# Patient Record
Sex: Male | Born: 1935 | Race: White | Hispanic: No | Marital: Married | Smoking: Former smoker
Health system: Southern US, Community
[De-identification: ages and names within clinical notes are randomized; demographics above are authoritative.]

## PROBLEM LIST (undated history)

## (undated) DIAGNOSIS — T82858A Stenosis of vascular prosthetic devices, implants and grafts, initial encounter: Secondary | ICD-10-CM

## (undated) DIAGNOSIS — I10 Essential (primary) hypertension: Secondary | ICD-10-CM

## (undated) DIAGNOSIS — J449 Chronic obstructive pulmonary disease, unspecified: Secondary | ICD-10-CM

## (undated) DIAGNOSIS — I219 Acute myocardial infarction, unspecified: Secondary | ICD-10-CM

## (undated) DIAGNOSIS — E119 Type 2 diabetes mellitus without complications: Secondary | ICD-10-CM

## (undated) HISTORY — PX: JOINT REPLACEMENT: SHX530

---

## 2009-12-02 HISTORY — PX: CORONARY ARTERY BYPASS GRAFT: SHX141

## 2014-01-15 ENCOUNTER — Encounter (HOSPITAL_COMMUNITY): Payer: Self-pay | Admitting: Emergency Medicine

## 2014-01-15 ENCOUNTER — Other Ambulatory Visit: Payer: Self-pay

## 2014-01-15 ENCOUNTER — Inpatient Hospital Stay (HOSPITAL_COMMUNITY)
Admission: EM | Admit: 2014-01-15 | Discharge: 2014-01-18 | DRG: 064 | Disposition: A | Discharge status: Hospice | Payer: Medicare Other | Attending: Internal Medicine | Admitting: Internal Medicine

## 2014-01-15 ENCOUNTER — Emergency Department (HOSPITAL_COMMUNITY): Payer: Medicare Other

## 2014-01-15 DIAGNOSIS — I63512 Cerebral infarction due to unspecified occlusion or stenosis of left middle cerebral artery: Secondary | ICD-10-CM

## 2014-01-15 DIAGNOSIS — R5381 Other malaise: Secondary | ICD-10-CM | POA: Diagnosis present

## 2014-01-15 DIAGNOSIS — F0390 Unspecified dementia without behavioral disturbance: Secondary | ICD-10-CM | POA: Diagnosis present

## 2014-01-15 DIAGNOSIS — J4489 Other specified chronic obstructive pulmonary disease: Secondary | ICD-10-CM | POA: Diagnosis present

## 2014-01-15 DIAGNOSIS — I6350 Cerebral infarction due to unspecified occlusion or stenosis of unspecified cerebral artery: Principal | ICD-10-CM | POA: Diagnosis present

## 2014-01-15 DIAGNOSIS — Z8673 Personal history of transient ischemic attack (TIA), and cerebral infarction without residual deficits: Secondary | ICD-10-CM

## 2014-01-15 DIAGNOSIS — G8190 Hemiplegia, unspecified affecting unspecified side: Secondary | ICD-10-CM | POA: Diagnosis present

## 2014-01-15 DIAGNOSIS — Z515 Encounter for palliative care: Secondary | ICD-10-CM

## 2014-01-15 DIAGNOSIS — I639 Cerebral infarction, unspecified: Secondary | ICD-10-CM

## 2014-01-15 DIAGNOSIS — I4901 Ventricular fibrillation: Secondary | ICD-10-CM | POA: Diagnosis present

## 2014-01-15 DIAGNOSIS — Z951 Presence of aortocoronary bypass graft: Secondary | ICD-10-CM

## 2014-01-15 DIAGNOSIS — I2510 Atherosclerotic heart disease of native coronary artery without angina pectoris: Secondary | ICD-10-CM | POA: Diagnosis present

## 2014-01-15 DIAGNOSIS — R4701 Aphasia: Secondary | ICD-10-CM | POA: Diagnosis present

## 2014-01-15 DIAGNOSIS — G936 Cerebral edema: Secondary | ICD-10-CM | POA: Diagnosis present

## 2014-01-15 DIAGNOSIS — I10 Essential (primary) hypertension: Secondary | ICD-10-CM | POA: Diagnosis present

## 2014-01-15 DIAGNOSIS — J449 Chronic obstructive pulmonary disease, unspecified: Secondary | ICD-10-CM | POA: Diagnosis present

## 2014-01-15 DIAGNOSIS — E119 Type 2 diabetes mellitus without complications: Secondary | ICD-10-CM | POA: Diagnosis present

## 2014-01-15 DIAGNOSIS — Z66 Do not resuscitate: Secondary | ICD-10-CM | POA: Diagnosis present

## 2014-01-15 DIAGNOSIS — R5383 Other fatigue: Secondary | ICD-10-CM

## 2014-01-15 DIAGNOSIS — I4892 Unspecified atrial flutter: Secondary | ICD-10-CM | POA: Diagnosis present

## 2014-01-15 DIAGNOSIS — Z87891 Personal history of nicotine dependence: Secondary | ICD-10-CM

## 2014-01-15 DIAGNOSIS — R0681 Apnea, not elsewhere classified: Secondary | ICD-10-CM | POA: Diagnosis present

## 2014-01-15 DIAGNOSIS — I4430 Unspecified atrioventricular block: Secondary | ICD-10-CM | POA: Diagnosis present

## 2014-01-15 DIAGNOSIS — I4891 Unspecified atrial fibrillation: Secondary | ICD-10-CM | POA: Diagnosis present

## 2014-01-15 HISTORY — DX: Chronic obstructive pulmonary disease, unspecified: J44.9

## 2014-01-15 HISTORY — DX: Acute myocardial infarction, unspecified: I21.9

## 2014-01-15 HISTORY — DX: Essential (primary) hypertension: I10

## 2014-01-15 HISTORY — DX: Type 2 diabetes mellitus without complications: E11.9

## 2014-01-15 HISTORY — DX: Stenosis of other vascular prosthetic devices, implants and grafts, initial encounter: T82.858A

## 2014-01-15 LAB — CBC WITH DIFFERENTIAL/PLATELET
BASOS ABS: 0 10*3/uL (ref 0.0–0.1)
BASOS PCT: 1 % (ref 0–1)
Eosinophils Absolute: 0.2 10*3/uL (ref 0.0–0.7)
Eosinophils Relative: 3 % (ref 0–5)
HEMATOCRIT: 47.2 % (ref 39.0–52.0)
Hemoglobin: 16.4 g/dL (ref 13.0–17.0)
LYMPHS PCT: 17 % (ref 12–46)
Lymphs Abs: 1.3 10*3/uL (ref 0.7–4.0)
MCH: 31 pg (ref 26.0–34.0)
MCHC: 34.7 g/dL (ref 30.0–36.0)
MCV: 89.2 fL (ref 78.0–100.0)
MONO ABS: 0.5 10*3/uL (ref 0.1–1.0)
MONOS PCT: 7 % (ref 3–12)
Neutro Abs: 5.4 10*3/uL (ref 1.7–7.7)
Neutrophils Relative %: 73 % (ref 43–77)
Platelets: 233 10*3/uL (ref 150–400)
RBC: 5.29 MIL/uL (ref 4.22–5.81)
RDW: 13.3 % (ref 11.5–15.5)
WBC: 7.4 10*3/uL (ref 4.0–10.5)

## 2014-01-15 LAB — URINE MICROSCOPIC-ADD ON

## 2014-01-15 LAB — COMPREHENSIVE METABOLIC PANEL
ALT: 14 U/L (ref 0–53)
AST: 18 U/L (ref 0–37)
Albumin: 3.7 g/dL (ref 3.5–5.2)
Alkaline Phosphatase: 121 U/L — ABNORMAL HIGH (ref 39–117)
BILIRUBIN TOTAL: 0.6 mg/dL (ref 0.3–1.2)
BUN: 21 mg/dL (ref 6–23)
CALCIUM: 9.5 mg/dL (ref 8.4–10.5)
CO2: 26 meq/L (ref 19–32)
CREATININE: 1.01 mg/dL (ref 0.50–1.35)
Chloride: 100 mEq/L (ref 96–112)
GFR, EST AFRICAN AMERICAN: 75 mL/min — AB (ref >90)
GFR, EST NON AFRICAN AMERICAN: 65 mL/min — AB (ref >90)
Glucose, Bld: 147 mg/dL — ABNORMAL HIGH (ref 70–99)
Potassium: 4.3 mEq/L (ref 3.7–5.3)
Sodium: 139 mEq/L (ref 137–147)
Total Protein: 7.9 g/dL (ref 6.0–8.3)

## 2014-01-15 LAB — URINALYSIS, ROUTINE W REFLEX MICROSCOPIC
Bilirubin Urine: NEGATIVE
Glucose, UA: NEGATIVE mg/dL
Ketones, ur: NEGATIVE mg/dL
LEUKOCYTES UA: NEGATIVE
NITRITE: NEGATIVE
PROTEIN: NEGATIVE mg/dL
SPECIFIC GRAVITY, URINE: 1.009 (ref 1.005–1.030)
UROBILINOGEN UA: 0.2 mg/dL (ref 0.0–1.0)
pH: 7 (ref 5.0–8.0)

## 2014-01-15 LAB — GLUCOSE, CAPILLARY: GLUCOSE-CAPILLARY: 121 mg/dL — AB (ref 70–99)

## 2014-01-15 LAB — CG4 I-STAT (LACTIC ACID): Lactic Acid, Venous: 1.05 mmol/L (ref 0.5–2.2)

## 2014-01-15 MED ORDER — ASPIRIN 300 MG RE SUPP
300.0000 mg | Freq: Every day | RECTAL | Status: DC
  Administered 2014-01-15: 300 mg via RECTAL
  Filled 2014-01-15: qty 1

## 2014-01-15 MED ORDER — DEXTROSE-NACL 5-0.45 % IV SOLN
INTRAVENOUS | Status: DC
  Administered 2014-01-15: 20:00:00 via INTRAVENOUS

## 2014-01-15 NOTE — ED Notes (Signed)
Dr. Reynolds at bedside.

## 2014-01-15 NOTE — ED Notes (Signed)
Teaching service MD at bedside.

## 2014-01-15 NOTE — Progress Notes (Signed)
I have seen the patient and reviewed the daily progress note by Tally Dueui MS IV and discussed the care of the patient with them.  Please see my H&P for my findings, assessment, and plans/additions.   Signed:  Dow Adolphichard Lynsay Fesperman, MD PGY-2 Internal Medicine Teaching Service Pager: [phone]

## 2014-01-15 NOTE — H&P (Signed)
Date: 01/15/2014               Patient Name:  Ricardo Johns MRN: [id number]  DOB: [birth year] Age / Sex: 80 y.o., male   PCP: Pcp Not In System              Medical Service: Internal Medicine Teaching Service              Attending Physician: Dr. Aletta Edouard, MD    First Contact: Dr. MS IV Pager:   Second Contact: Dr. Dow Johns Pager: [phone]            After Hours (After 5p/  First Contact Pager: [phone]  weekends / holidays): Second Contact Pager: [phone]   Chief Complaint: Ischemic Stroke   History of Present Illness: [name] is a 80 yo man with history of CAD s/p CABG and multiple stents, HTN, T2DM, COPD who presents with altered mental status. Patient was last seen normal at 11:30pm on 01/14/2014. Wife found husband unresponsive in the morning (6-7am) despite multiple attempts to wake up patient. Patient was transported via EMS to hospital has not regained any conscious awareness for >15 hours. Patient has been performing ADLs (dressing, driving, feeding) without any health concerns the week prior. Patient did not have any fevers, URI symptoms, headaches, SOB, or chest pain the week prior. Patient does not have a history of seizures or clots.  Family denies any past medical history of arrhythmias, heart valve abnormalities or septum defects, but did report that he has a 50-60 pack year smoking history. No family history of stroke, heart disease, heart arrhythmias, or hypercoagulable disorders. Patient has been compliant with medication regiments and has been going to his cardiologists, and pulmonologist regularly.  Family wishes to keep patient full code until the youngest daughter arrives from [place].   Cardiologist: Ricardo Finlay, MD  PCP: Ricardo Pont, MD    Review of Systems: Unable to clinical status  Meds: Current Facility-Administered Medications  Medication Dose Route Frequency Provider Last Rate Last Dose  . dextrose 5 %-0.45 % sodium chloride infusion    Intravenous Continuous Ricardo Adolph, MD 75 mL/hr at 01/15/14 1957      Allergies: Allergies as of 01/15/2014  . (No Known Allergies)   Past Medical History  Diagnosis Date  . Hypertension   . Diabetes mellitus without complication     type II  . MI (myocardial infarction)   . Bypass graft stenosis   . COPD (chronic obstructive pulmonary disease)    Past Surgical History  Procedure Laterality Date  . Coronary artery bypass graft  2011  . Joint replacement      hip replacement unknown laterality   History reviewed. No pertinent family history. History   Social History  . Marital Status: Married    Spouse Name: N/A    Number of Children: N/A  . Years of Education: N/A   Occupational History  . Not on file.   Social History Main Topics  . Smoking status: Former Smoker -- 1.00 packs/day for 60 years    Quit date: 12/02/1998  . Smokeless tobacco: Not on file  . Alcohol Use: No  . Drug Use: No  . Sexual Activity: Not on file   Other Topics Concern  . Not on file   Social History Narrative   Patient lives with wife and both stay with daughter's family in [place].    Physical Exam: Blood pressure 144/65, pulse 62, temperature 96.9 F (36.1 C), temperature source  Rectal, resp. rate 16, SpO2 93.00%.  General appearance: lying in bed with eyes closed, breathing with some effort, GCS 10 (3 for eye opening, 2 for verbal response, and 5 for motor)  Head: Normocephalic, without obvious abnormality, atraumatic  Eyes: pupils reactive to light  Neck: no adenopathy, no carotid bruit, no JVD and supple, symmetrical, trachea midline  Lungs: clear to auscultation bilaterally  Heart: irregular rate, irregular rhythm, extra beats between S1 and S2  Abdomen: soft, non-tender; bowel sounds normal; no masses, no organomegaly  Extremities: no edema bilaterally  Pulses: 2+ radial pulses, but irregular rate  Neurologic: Mental status: GCS 10 (3 for eye opening, 2 for verbal response,  5 for motor)  Motor: R side: limp in arms and leg, lack of tone; L side: leg is held on position, hard to bent knee, arm moves to pain and performs random movements  Reflexes: Hyperreflexive on R patellar, and upgoing Babinski's on R, 1+ reflex/unable to perform reflex on L patellar, minimal downgoing toes on L Babinski's   Lab results: Basic Metabolic Panel:  Recent Labs  40/98/11 1310  NA 139  K 4.3  CL 100  CO2 26  GLUCOSE 147*  BUN 21  CREATININE 1.01  CALCIUM 9.5   Liver Function Tests:  Recent Labs  01/15/14 1310  AST 18  ALT 14  ALKPHOS 121*  BILITOT 0.6  PROT 7.9  ALBUMIN 3.7   CBC:  Recent Labs  01/15/14 1310  WBC 7.4  NEUTROABS 5.4  HGB 16.4  HCT 47.2  MCV 89.2  PLT 233    Urinalysis:  Recent Labs  01/15/14 1313  COLORURINE YELLOW  LABSPEC 1.009  PHURINE 7.0  GLUCOSEU NEGATIVE  HGBUR LARGE*  BILIRUBINUR NEGATIVE  KETONESUR NEGATIVE  PROTEINUR NEGATIVE  UROBILINOGEN 0.2  NITRITE NEGATIVE  LEUKOCYTESUR NEGATIVE    Imaging results:  Ct Head Wo Contrast  01/15/2014   CLINICAL DATA:  Unresponsive  EXAM: CT HEAD WITHOUT CONTRAST  TECHNIQUE: Contiguous axial images were obtained from the base of the skull through the vertex without intravenous contrast.  COMPARISON:  None.  FINDINGS: Findings are concerning for large territory left MCA distribution infarct with hyperdense left MCA sign (image 15, series 2) and blurring of the gray-white differentiation involving the majority of the left MCA territory and associated and very mild sulcal effacement. No definitive evidence of hemorrhagic conversion of this large territory infarct.  Age-appropriate atrophy with diffuse sulcal prominence and centralized volume loss with commensurate ex vacuo dilatation of the ventricular system. There is very mild effacement of the left lateral ventricle and associated very minimal (approximately 3 mm) of left to right midline shift. No definite intraparenchymal or  extra-axial mass or hemorrhage. Limited visualization of the paranasal sinuses and mastoid air cells are normal. Regional soft tissues are normal. Post bilateral cataract surgery.  IMPRESSION: Findings compatible with acute large territory left MCA distribution infarct with associated hyperdense left MCA sign, regional sulcal effacement and mild (approximately 3 mm) of left-to-right midline shift. No definitive evidence of hemorrhagic conversion of this large territory infarct.  Critical Value/emergent results were called by telephone at the time of interpretation on 01/15/2014 at 1:29 PM to Dr. Ross Marcus , who verbally acknowledged these results.   Electronically Signed   By: Simonne Come M.D.   On: 01/15/2014 13:34    Other results: EKG: Atrial flutter with 4:1 AV block, isolated Q wave in aVL.   Assessment & Plan by Problem: Principal Problem:   Acute ischemic  left MCA stroke Active Problems:   Diabetes   CAD (coronary artery disease)   Dementia   COPD (chronic obstructive pulmonary disease)   Atrial flutter   # L MCA ischemic stroke: Last seen normal on 01/14/2014 @ 11:30pm. Has right sided weakness. CT head has shown midline shift and hypodensity in regions surrounding L ventricle. MRI pending. Stroke very catastrophic with high mortality. Seen by Dr Thad Rangereynolds of neurology.  Plan  - admit to SDU - frequent neuro checks - able to maintain airway but high risk for intubation - patient has a high risk for aspiration and this has been explained to the family -1/2 NS at 110200mL/h  -Aspirin 300mg  rectally qD  -Telemetry for V fib or V tachy  -permassive hypertension   -Neurology recs appreciated  -MRI head pending  - patient is full code currently  - palliative care consult for GOC  - further stroke evaluation including carotid doppler and echo will depend on GOC   Dispo: Disposition is deferred at this time, awaiting improvement of current medical problems. Anticipated discharge in  approximately 2-3 day(s).   The patient does have a current PCP Dr Ronnette HilaVijah Patel , therefore is not require [facility]PC follow-up after discharge.   The patient does not have transportation limitations that hinder transportation to clinic appointments.   Signed:  Dow Adolphichard Tyner Codner, MD PGY-2 Internal Medicine Teaching Service 01/15/2014, 8:15 PM

## 2014-01-15 NOTE — Progress Notes (Signed)
Attempted to call MRI department in regards to MRI order, no response. Called radiology department, stated that MRI had gone home for the evening. MD notified and made aware that MRI would not be performed tonight. Will continue to monitor.   Rochele PagesHancock, Trisa Cranor A, RN

## 2014-01-15 NOTE — ED Notes (Signed)
RN accompanied pt to CT  

## 2014-01-15 NOTE — ED Notes (Signed)
Per EMS pt came from home where he was staying with son - with LSN last night approximately 2330. Pt normally independent walks and talks. Did not get out of bed this AM. Only responding to verbal stimuli. Family denies recent illness, fever, pain. CBG 114. VSS. afib on monitor - family denies hx of same.

## 2014-01-15 NOTE — Consult Note (Addendum)
Referring Physician: Horton    Chief Complaint: Right sided weakness, mute  HPI: Ricardo Johns is an 80 y.o. male who went to bed normal last night.  He and his wife were visiting family her ion [place].  He did not get out of bed this morning and When his wife went to check on him he was unable to speak and noted to have right sided weakness.  EMS was called at that time and the patient was brought in for evaluation.    Date last known well: Date: 01/14/2014 Time last known well: Time: 11:30 tPA Given: No: Outside time window  Past Medical History  Diagnosis Date  . Hypertension   . Diabetes mellitus without complication     type II  . MI (myocardial infarction)   . Bypass graft stenosis     Past Surgical History  Procedure Laterality Date  . Joint replacement      Family history: Unable to obtain  Social History:  No current history of alcohol, tobacco, or illicit drug abuse.  Allergies: No Known Allergies  Medications: I have reviewed the patient's current medications. Prior to Admission:  Current outpatient prescriptions:aspirin 325 MG EC tablet, Take 325 mg by mouth daily as needed (Headache)., Disp: , Rfl: ;  Aspirin-Caffeine 500-32.5 MG TABS, Take 1 tablet by mouth daily as needed (Headache)., Disp: , Rfl: ;  Carboxymethylcellul-Glycerin (REFRESH OPTIVE OP), Apply 1 drop to eye daily as needed., Disp: , Rfl: ;  dutasteride (AVODART) 0.5 MG capsule, Take 0.5 mg by mouth daily., Disp: , Rfl:  gabapentin (NEURONTIN) 300 MG capsule, Take 300 mg by mouth 2 (two) times daily., Disp: , Rfl: ;  metFORMIN (GLUCOPHAGE-XR) 500 MG 24 hr tablet, Take 500 mg by mouth 2 (two) times daily., Disp: , Rfl: ;  Methylcellulose, Laxative, (CITRUCEL) 500 MG TABS, Take 500 mg by mouth daily., Disp: , Rfl: ;  mometasone-formoterol (DULERA) 100-5 MCG/ACT AERO, Inhale 1 puff into the lungs 2 (two) times daily., Disp: , Rfl:  naproxen sodium (ANAPROX) 220 MG tablet, Take 220 mg by mouth daily as  needed (Pain)., Disp: , Rfl: ;  polyethylene glycol (MIRALAX / GLYCOLAX) packet, Take 17 g by mouth daily., Disp: , Rfl: ;  tamsulosin (FLOMAX) 0.4 MG CAPS capsule, Take 0.4 mg by mouth at bedtime., Disp: , Rfl:   ROS: History obtained from wife  General ROS: negative for - chills, fatigue, fever, night sweats, weight gain or weight loss Psychological ROS: negative for - behavioral disorder, hallucinations, memory difficulties, mood swings or suicidal ideation Ophthalmic ROS: negative for - blurry vision, double vision, eye pain or loss of vision ENT ROS: negative for - epistaxis, nasal discharge, oral lesions, sore throat, tinnitus or vertigo Allergy and Immunology ROS: negative for - hives or itchy/watery eyes Hematological and Lymphatic ROS: negative for - bleeding problems, bruising or swollen lymph nodes Endocrine ROS: negative for - galactorrhea, hair pattern changes, polydipsia/polyuria or temperature intolerance Respiratory ROS: negative for - cough, hemoptysis, shortness of breath or wheezing Cardiovascular ROS: negative for - chest pain, dyspnea on exertion, edema or irregular heartbeat Gastrointestinal ROS: negative for - abdominal pain, diarrhea, hematemesis, nausea/vomiting or stool incontinence Genito-Urinary ROS: negative for - dysuria, hematuria, incontinence or urinary frequency/urgency Musculoskeletal ROS: negative for - joint swelling or muscular weakness Neurological ROS: as noted in HPI Dermatological ROS: negative for rash and skin lesion changes  Physical Examination: Blood pressure 170/57, pulse 61, temperature 96.9 F (36.1 C), temperature source Rectal, resp. rate 16, SpO2 96.00%.  Neurologic Examination: Mental Status: Alert.  Unable to follow verbal commands.  Will take some nonverbal cues.  Mute. Cranial Nerves: II: Discs flat bilaterally; Does not blink to confrontation from the right.  Pupils equal, round, reactive to light and accommodation III,IV, VI:  ptosis not present, Left gaze preference but extra-ocular motions intact bilaterally with oculocephalic maneuver V,VII: right facial droop VIII: unable to test IX,X: gag reflex reduced XI: decreased shoulder shrug on the right XII: unable to test Motor: Right : Upper extremity   0/5    Left:     Upper extremity   5/5  Lower extremity   0/5     Lower extremity   5/5 Sensory: Does not respond to noxious stimuli in any extremity Deep Tendon Reflexes: 2+ in the upper extremities, 1+ at the knees and absent at the ankles.   Plantars: Right: upgoing   Left: upgoing Cerebellar: Unable to perform Gait: Unable to test CV: pulses palpable throughout     Laboratory Studies:  Basic Metabolic Panel:  Recent Labs Lab 01/15/14 1310  NA 139  K 4.3  CL 100  CO2 26  GLUCOSE 147*  BUN 21  CREATININE 1.01  CALCIUM 9.5    Liver Function Tests:  Recent Labs Lab 01/15/14 1310  AST 18  ALT 14  ALKPHOS 121*  BILITOT 0.6  PROT 7.9  ALBUMIN 3.7   No results found for this basename: LIPASE, AMYLASE,  in the last 168 hours No results found for this basename: AMMONIA,  in the last 168 hours  CBC:  Recent Labs Lab 01/15/14 1310  WBC 7.4  NEUTROABS 5.4  HGB 16.4  HCT 47.2  MCV 89.2  PLT 233    Cardiac Enzymes: No results found for this basename: CKTOTAL, CKMB, CKMBINDEX, TROPONINI,  in the last 168 hours  BNP: No components found with this basename: POCBNP,   CBG: No results found for this basename: GLUCAP,  in the last 168 hours  Microbiology: No results found for this or any previous visit.  Coagulation Studies: No results found for this basename: LABPROT, INR,  in the last 72 hours  Urinalysis:  Recent Labs Lab 01/15/14 1313  COLORURINE YELLOW  LABSPEC 1.009  PHURINE 7.0  GLUCOSEU NEGATIVE  HGBUR LARGE*  BILIRUBINUR NEGATIVE  KETONESUR NEGATIVE  PROTEINUR NEGATIVE  UROBILINOGEN 0.2  NITRITE NEGATIVE  LEUKOCYTESUR NEGATIVE    Lipid Panel: No results  found for this basename: chol, trig, hdl, cholhdl, vldl, ldlcalc    HgbA1C:  No results found for this basename: HGBA1C    Urine Drug Screen:   No results found for this basename: labopia, cocainscrnur, labbenz, amphetmu, thcu, labbarb    Alcohol Level: No results found for this basename: ETH,  in the last 168 hours  Other results: EKG: atrial flutter with 4:1 AV block, rate 59 bpm.  Imaging: Ct Head Wo Contrast  01/15/2014   CLINICAL DATA:  Unresponsive  EXAM: CT HEAD WITHOUT CONTRAST  TECHNIQUE: Contiguous axial images were obtained from the base of the skull through the vertex without intravenous contrast.  COMPARISON:  None.  FINDINGS: Findings are concerning for large territory left MCA distribution infarct with hyperdense left MCA sign (image 15, series 2) and blurring of the gray-white differentiation involving the majority of the left MCA territory and associated and very mild sulcal effacement. No definitive evidence of hemorrhagic conversion of this large territory infarct.  Age-appropriate atrophy with diffuse sulcal prominence and centralized volume loss with commensurate ex vacuo dilatation of  the ventricular system. There is very mild effacement of the left lateral ventricle and associated very minimal (approximately 3 mm) of left to right midline shift. No definite intraparenchymal or extra-axial mass or hemorrhage. Limited visualization of the paranasal sinuses and mastoid air cells are normal. Regional soft tissues are normal. Post bilateral cataract surgery.  IMPRESSION: Findings compatible with acute large territory left MCA distribution infarct with associated hyperdense left MCA sign, regional sulcal effacement and mild (approximately 3 mm) of left-to-right midline shift. No definitive evidence of hemorrhagic conversion of this large territory infarct.  Critical Value/emergent results were called by telephone at the time of interpretation on 01/15/2014 at 1:29 PM to Dr. Ross MarcusOURTNEY,  HORTON , who verbally acknowledged these results.   Electronically Signed   By: Simonne ComeJohn  Watts M.D.   On: 01/15/2014 13:34    Assessment: 80 y.o. male presenting with new onset right hemiparesis, RHH, expressive and receptive aphasia.  Head CT reviewed and shows a hyperdense M1 on the left and areas of hypodensity within the distribution of the left MCA.  There is a 3mm left to right shift.  Patient is outside of the time window for tPA and intervention.  Further work up recommended.  Diagnosis and prognosis discussed with family.    Stroke Risk Factors - diabetes mellitus and hypertension  Plan: 1. HgbA1c, fasting lipid panel 2. MRI, MRA  of the brain without contrast 3. PT consult, OT consult, Speech consult 4. Echocardiogram 5. Carotid dopplers 6. Prophylactic therapy-Antiplatelet med: Aspirin - dose 300mg  rectally 7. Code status discussed with family.  Patient now a full code but they are discussing their options 8. Telemetry monitoring 9. Frequent neuro checks   Case discussed with Dr. Antony OdeaHorton  Marcanthony Sleight, MD [facility] [phone] 01/15/2014, 2:42 PM

## 2014-01-15 NOTE — ED Provider Notes (Signed)
CSN: [id number]     Arrival date & time 01/15/14  1238 History   First MD Initiated Contact with Patient 01/15/14 1247     Chief Complaint  Patient presents with  . Altered Mental Status     (Consider location/radiation/quality/duration/timing/severity/associated sxs/prior Treatment) HPI  This is an 80 year old male with a history of hypertension, diabetes, and coronary artery disease who presents with lethargy. Per the patient's son and wife, patient was last seen normal at approximately 11:30 PM last night. When he did wake up this morning at his usual time, his wife went to check on him and was unable to wake him up. He was only minimally responsive to verbal stimulus. EMS was called. At baseline, patient performs his daily ADLs by himself. No history of stroke. They deny any recent fevers or illnesses.  Level 5 caveat for altered mental status Past Medical History  Diagnosis Date  . Hypertension   . Diabetes mellitus without complication     type II  . MI (myocardial infarction)   . Bypass graft stenosis    Past Surgical History  Procedure Laterality Date  . Joint replacement     No family history on file. History  Substance Use Topics  . Smoking status: Not on file  . Smokeless tobacco: Not on file  . Alcohol Use: Not on file    Review of Systems  Unable to perform ROS: Mental status change      Allergies  Review of patient's allergies indicates not on file.  Home Medications  No current outpatient prescriptions on file. BP 154/58  Temp(Src) 96.9 F (36.1 C) (Rectal)  Resp 14  SpO2 96% Physical Exam  Nursing note and vitals reviewed. Constitutional: No distress.  Unresponsive, elderly  HENT:  Head: Normocephalic and atraumatic.  Mouth/Throat: Oropharynx is clear and moist.  Edentulous  Eyes: Pupils are equal, round, and reactive to light.  Pupils 3 mm and reactive bilaterally  Neck: Neck supple.  Cardiovascular: Normal rate and normal heart sounds.    No murmur heard. Irregularly irregular rhythm  Pulmonary/Chest: Effort normal and breath sounds normal. No respiratory distress. He has no wheezes.  Snoring respirations  Abdominal: Soft. There is no tenderness.  Musculoskeletal: He exhibits no edema.  Neurological: GCS eye subscore is 3. GCS verbal subscore is 2. GCS motor subscore is 5.  Lethargic, opens eyes to verbal stimulus, not following commands, spontaneous movement noted of the left upper and lower extremity, no spontaneous movement noted on the right, + Babinski on the right, - Babinski on the left  Skin: Skin is warm and dry.  Psychiatric:  Unable to assess secondary to mental status    ED Course  Procedures (including critical care time) Labs Review Labs Reviewed  URINALYSIS, ROUTINE W REFLEX MICROSCOPIC - Abnormal; Notable for the following:    Hgb urine dipstick LARGE (*)    All other components within normal limits  CBC WITH DIFFERENTIAL  URINE MICROSCOPIC-ADD ON  COMPREHENSIVE METABOLIC PANEL  CG4 I-STAT (LACTIC ACID)   Imaging Review Ct Head Wo Contrast  01/15/2014   CLINICAL DATA:  Unresponsive  EXAM: CT HEAD WITHOUT CONTRAST  TECHNIQUE: Contiguous axial images were obtained from the base of the skull through the vertex without intravenous contrast.  COMPARISON:  None.  FINDINGS: Findings are concerning for large territory left MCA distribution infarct with hyperdense left MCA sign (image 15, series 2) and blurring of the gray-white differentiation involving the majority of the left MCA territory and associated and very  mild sulcal effacement. No definitive evidence of hemorrhagic conversion of this large territory infarct.  Age-appropriate atrophy with diffuse sulcal prominence and centralized volume loss with commensurate ex vacuo dilatation of the ventricular system. There is very mild effacement of the left lateral ventricle and associated very minimal (approximately 3 mm) of left to right midline shift. No  definite intraparenchymal or extra-axial mass or hemorrhage. Limited visualization of the paranasal sinuses and mastoid air cells are normal. Regional soft tissues are normal. Post bilateral cataract surgery.  IMPRESSION: Findings compatible with acute large territory left MCA distribution infarct with associated hyperdense left MCA sign, regional sulcal effacement and mild (approximately 3 mm) of left-to-right midline shift. No definitive evidence of hemorrhagic conversion of this large territory infarct.  Critical Value/emergent results were called by telephone at the time of interpretation on 01/15/2014 at 1:29 PM to Dr. Ross MarcusOURTNEY, HORTON , who verbally acknowledged these results.   Electronically Signed   By: Simonne ComeJohn  Watts M.D.   On: 01/15/2014 13:34    EKG Interpretation         EKG independently reviewed by myself: Atrial flutter with a rate of 59, for one block, no evidence of ST elevation or acute ischemia, no prior for comparison  CRITICAL CARE Performed by: Ross MarcusHORTON, COURTNEY, F   Total critical care time: 30 min  Critical care time was exclusive of separately billable procedures and treating other patients.  Critical care was necessary to treat or prevent imminent or life-threatening deterioration.  Critical care was time spent personally by me on the following activities: development of treatment plan with patient and/or surrogate as well as nursing, discussions with consultants, evaluation of patient's response to treatment, examination of patient, obtaining history from patient or surrogate, ordering and performing treatments and interventions, ordering and review of laboratory studies, ordering and review of radiographic studies, pulse oximetry and re-evaluation of patient's condition.   MDM   Final diagnoses:  Acute ischemic stroke   Patient presents with lethargy.  Last seen normal at 11:30 PM last night. No purposeful movement on the right side with a Babinski sign. Patient has  a GCS of 10. Concern for subacute stroke. Per discussion with patient's family, he does not have a living will or advanced directive. At this time they would like him to be full code.  He is out of the window for TPA.  Basic labwork and head CT were obtained. EKG shows atrial flutter with a rate of 59. Per the patient's family, no known history of atrial flutter and he is not currently on anticoagulation. Head CT shows findings compatible with a large MCA territory stroke on the left with a MCA sign. She also has mild left to right midline shift.  This is consistent with my physical exam. Neurology was consulted. The patient's family was informed of these findings. Patient is currently n.p.o. for risk for aspiration.     Dr. Thad Rangereynolds at the bedside. Patient will be admitted for stroke workup. At this time he is still full code. He is an aspiration risk however will not intubate at this time per discussion with family and Dr. Thad Rangereynolds as his oxygenation is still within normal limits.  Shon Batonourtney F Horton, MD 01/15/14 [phone]

## 2014-01-15 NOTE — Progress Notes (Signed)
Medical Student Hospital Admission Note Date: 01/15/2014  Patient name: Ricardo Johns Medical record number: [id number] Date of birth: [birth year] Age: 80 y.o. Gender: male PCP: Pcp Not In System  Medical Service: Internal Medicine Teaching Service: [facility] Service  Attending physician: Dr. Alric Ran    Chief Complaint: Altered Mental Status  History of Present Illness:  Ricardo Johns is a 80 yo man with history of CAD s/p CABG and multiple stents, HTN, T2DM, COPD who presents with altered mental status. Patient was last seen normal at 11:30pm. Wife found husband unresponsive in the morning (6-7am) despite multiple attempts to wake up patient. Patient was transported via EMS to hospital has not regained any conscious awareness for >15 hours. Patient has been performing ADLs (dressing, driving, feeding) without any health concerns the week prior. Patient did not have any fevers, URI symptoms, headaches, SOB, or chest pain the week prior. Patient does not have a history of seizures or clots.  Family denies any past medical history of arrhythmias, heart valve abnormalities or septum defects, but did report that he has a 50-60 pack year smoking history. No family history of stroke, heart disease, heart arrhythmias, or hypercoagulable disorders. Patient has been compliant with medication regiments and has been going to his cardiologists, and pulmonologist regularly.  Family wishes to keep patient alive until the youngest daughter arrives from [place]. Power of Gerrit Friends is likely wife and the oldest daughter, but the family wants to make decisions together. They will make more decision once the whole family is together.   Cardiologist: Hoy Finlay, MD PCP: Lorelei Pont, MD  Meds: Current Outpatient Rx  Name  Route  Sig  Dispense  Refill  . aspirin 325 MG EC tablet   Oral   Take 325 mg by mouth daily as needed (Headache).         . Aspirin-Caffeine 500-32.5 MG TABS   Oral   Take 1 tablet by  mouth daily as needed (Headache).         . Carboxymethylcellul-Glycerin (REFRESH OPTIVE OP)   Ophthalmic   Apply 1 drop to eye daily as needed.         . dutasteride (AVODART) 0.5 MG capsule   Oral   Take 0.5 mg by mouth daily.         Marland Kitchen gabapentin (NEURONTIN) 300 MG capsule   Oral   Take 300 mg by mouth 2 (two) times daily.         . metFORMIN (GLUCOPHAGE-XR) 500 MG 24 hr tablet   Oral   Take 500 mg by mouth 2 (two) times daily.         . Methylcellulose, Laxative, (CITRUCEL) 500 MG TABS   Oral   Take 500 mg by mouth daily.         . mometasone-formoterol (DULERA) 100-5 MCG/ACT AERO   Inhalation   Inhale 1 puff into the lungs 2 (two) times daily.         . naproxen sodium (ANAPROX) 220 MG tablet   Oral   Take 220 mg by mouth daily as needed (Pain).         . polyethylene glycol (MIRALAX / GLYCOLAX) packet   Oral   Take 17 g by mouth daily.         . tamsulosin (FLOMAX) 0.4 MG CAPS capsule   Oral   Take 0.4 mg by mouth at bedtime.           Allergies: Allergies as of 01/15/2014  . (No  Known Allergies)   Past Medical History  Diagnosis Date  . Hypertension   . Diabetes mellitus without complication     type II  . MI (myocardial infarction)   . Bypass graft stenosis   . COPD (chronic obstructive pulmonary disease)    Past Surgical History  Procedure Laterality Date  . Coronary artery bypass graft  2011  . Joint replacement      hip replacement unknown laterality   History reviewed. No pertinent family history. History   Social History  . Marital Status: Married    Spouse Name: N/A    Number of Children: N/A  . Years of Education: N/A   Occupational History  . Not on file.   Social History Main Topics  . Smoking status: Former Smoker -- 1.00 packs/day for 60 years    Quit date: 12/02/1998  . Smokeless tobacco: Not on file  . Alcohol Use: No  . Drug Use: No  . Sexual Activity: Not on file   Other Topics Concern  . Not on  file   Social History Narrative  . No narrative on file    Review of Systems: Pertinent items are noted in HPI.  Physical Exam: Blood pressure 144/65, pulse 62, temperature 96.9 F (36.1 C), temperature source Rectal, resp. rate 16, SpO2 93.00%. General appearance: lying in bed with eyes closed, breathing with some effort, GCS 10 (3 for eye opening, 2 for verbal response, and 5 for motor) Head: Normocephalic, without obvious abnormality, atraumatic Eyes: pupils reactive to light Neck: no adenopathy, no carotid bruit, no JVD and supple, symmetrical, trachea midline Lungs: clear to auscultation bilaterally Heart: irregular rate, irregular rhythm, extra beats between S1 and S2 Abdomen: soft, non-tender; bowel sounds normal; no masses,  no organomegaly Extremities: no edema bilaterally Pulses: 2+ radial pulses, but irregular rate Neurologic: Mental status: GCS 10 (3 for eye opening, 2 for verbal response, 5 for motor) Motor: R side: limp in arms and leg, lack of tone; L side: leg is held on position, hard to bent knee, arm moves to pain and performs random movements Reflexes: Hyperreflexive on R patellar, and upgoing Babinski's on R, 1+ reflex/unable to perform reflex on L patellar, minimal downgoing toes on L Babinski's  Lab results: Lab Results  Component Value Date   CREATININE 1.01 01/15/2014   BUN 21 01/15/2014   NA 139 01/15/2014   K 4.3 01/15/2014   CL 100 01/15/2014   CO2 26 01/15/2014   Lab Results  Component Value Date   CALCIUM 9.5 01/15/2014   CBC    Component Value Date/Time   WBC 7.4 01/15/2014 1310   RBC 5.29 01/15/2014 1310   HGB 16.4 01/15/2014 1310   HCT 47.2 01/15/2014 1310   PLT 233 01/15/2014 1310   MCV 89.2 01/15/2014 1310   MCH 31.0 01/15/2014 1310   MCHC 34.7 01/15/2014 1310   RDW 13.3 01/15/2014 1310   LYMPHSABS 1.3 01/15/2014 1310   MONOABS 0.5 01/15/2014 1310   EOSABS 0.2 01/15/2014 1310   BASOSABS 0.0 01/15/2014 1310   Urinalysis    Component Value  Date/Time   COLORURINE YELLOW 01/15/2014 1313   APPEARANCEUR CLEAR 01/15/2014 1313   LABSPEC 1.009 01/15/2014 1313   PHURINE 7.0 01/15/2014 1313   GLUCOSEU NEGATIVE 01/15/2014 1313   HGBUR LARGE* 01/15/2014 1313   BILIRUBINUR NEGATIVE 01/15/2014 1313   KETONESUR NEGATIVE 01/15/2014 1313   PROTEINUR NEGATIVE 01/15/2014 1313   UROBILINOGEN 0.2 01/15/2014 1313   NITRITE NEGATIVE 01/15/2014 1313  LEUKOCYTESUR NEGATIVE 01/15/2014 1313       Imaging results:  Ct Head Wo Contrast  01/15/2014   CLINICAL DATA:  Unresponsive  EXAM: CT HEAD WITHOUT CONTRAST  TECHNIQUE: Contiguous axial images were obtained from the base of the skull through the vertex without intravenous contrast.  COMPARISON:  None.  FINDINGS: Findings are concerning for large territory left MCA distribution infarct with hyperdense left MCA sign (image 15, series 2) and blurring of the gray-white differentiation involving the majority of the left MCA territory and associated and very mild sulcal effacement. No definitive evidence of hemorrhagic conversion of this large territory infarct.  Age-appropriate atrophy with diffuse sulcal prominence and centralized volume loss with commensurate ex vacuo dilatation of the ventricular system. There is very mild effacement of the left lateral ventricle and associated very minimal (approximately 3 mm) of left to right midline shift. No definite intraparenchymal or extra-axial mass or hemorrhage. Limited visualization of the paranasal sinuses and mastoid air cells are normal. Regional soft tissues are normal. Post bilateral cataract surgery.  IMPRESSION: Findings compatible with acute large territory left MCA distribution infarct with associated hyperdense left MCA sign, regional sulcal effacement and mild (approximately 3 mm) of left-to-right midline shift. No definitive evidence of hemorrhagic conversion of this large territory infarct.  Critical Value/emergent results were called by telephone at the time of  interpretation on 01/15/2014 at 1:29 PM to Dr. Ross MarcusOURTNEY, HORTON , who verbally acknowledged these results.   Electronically Signed   By: Simonne ComeJohn  Watts M.D.   On: 01/15/2014 13:34    Other results: EKG: Atrial flutter with 4:1 AV block, isolated Q wave in aVL.  Assessment & Plan by Problem: Principal Problem:   Acute ischemic left MCA stroke Active Problems:   Diabetes   CAD (coronary artery disease)   Dementia   COPD (chronic obstructive pulmonary disease)   Atrial flutter  Ricardo Johns is a 80 yo man with history of CAD s/p CABG & stents, HTN, T2DM, and COPD who presents with L MCA ischemic stroke confirmed by CT head.   # L MCA ischemic stroke: Patient has had a stroke that is not eligible for antithrombotic treatment given unknown time of stroke. Stroke is likely catastrophic as patient has not gained any consciousness since arrival in ED. CT head has shown midline shift and hypodensity in regions surrounding L ventricle. Using the [place] Neurologic scale for acute ischemic stroke, patient scores <6.5, which predicts an increased 30 day mortality and poor outcomes in 6 months. His NIHSS score is between 11-15, which indicates that there is only about 23% chance of an excellent outcome. BP averages 140-180/60-70, and does not need management given his history of stroke. Family has been informed of the poor prognosis, and they have indicated desire to keep patient alive until 3rd daughter arrives. They have also expressed that they do not want to keep patient alive through extreme means, but will make a definitive decision once the third daughter arrives. -Consult palliative care -1/2 NS at 16100mL/h -Aspirin 300mg  rectally qD -Telemetry for V fib or V tachy -Hold all home medications -Neurology recs appreciated -MRI head: may not be necessary pending patient family's decision  This is a Psychologist, occupationalMedical Student Note.  The care of the patient was discussed with Dr. Zada GirtKazibwe and the assessment and plan  was formulated with their assistance.  Please see their note for official documentation of the patient encounter.   Signed: Rhea PinkJiang, Heavenleigh Petruzzi 01/15/2014, 4:55 PM

## 2014-01-15 NOTE — ED Notes (Addendum)
Pt currently with snoring respirations, unlabored, 96% on RA. MD aware. Pt responding to some verbal and painful stimuli. Spontaneously moving left upper and lower extremities. No right sided movement noted. Pt gazes mostly to left side.

## 2014-01-16 DIAGNOSIS — I4891 Unspecified atrial fibrillation: Secondary | ICD-10-CM

## 2014-01-16 DIAGNOSIS — I6350 Cerebral infarction due to unspecified occlusion or stenosis of unspecified cerebral artery: Secondary | ICD-10-CM

## 2014-01-16 DIAGNOSIS — I369 Nonrheumatic tricuspid valve disorder, unspecified: Secondary | ICD-10-CM

## 2014-01-16 LAB — CBC
HEMATOCRIT: 43.9 % (ref 39.0–52.0)
HEMOGLOBIN: 15.3 g/dL (ref 13.0–17.0)
MCH: 31 pg (ref 26.0–34.0)
MCHC: 34.9 g/dL (ref 30.0–36.0)
MCV: 89 fL (ref 78.0–100.0)
Platelets: 228 10*3/uL (ref 150–400)
RBC: 4.93 MIL/uL (ref 4.22–5.81)
RDW: 13.3 % (ref 11.5–15.5)
WBC: 10.5 10*3/uL (ref 4.0–10.5)

## 2014-01-16 LAB — LIPID PANEL
Cholesterol: 147 mg/dL (ref 0–200)
HDL: 48 mg/dL (ref >39)
LDL Cholesterol: 74 mg/dL (ref 0–99)
Total CHOL/HDL Ratio: 3.1 RATIO
Triglycerides: 123 mg/dL (ref <150)
VLDL: 25 mg/dL (ref 0–40)

## 2014-01-16 LAB — GLUCOSE, CAPILLARY
GLUCOSE-CAPILLARY: 173 mg/dL — AB (ref 70–99)
Glucose-Capillary: 151 mg/dL — ABNORMAL HIGH (ref 70–99)
Glucose-Capillary: 180 mg/dL — ABNORMAL HIGH (ref 70–99)
Glucose-Capillary: 156 mg/dL — ABNORMAL HIGH (ref 70–99)
Glucose-Capillary: 147 mg/dL — ABNORMAL HIGH (ref 70–99)
Glucose-Capillary: 136 mg/dL — ABNORMAL HIGH (ref 70–99)

## 2014-01-16 LAB — MRSA PCR SCREENING: MRSA by PCR: NEGATIVE

## 2014-01-16 LAB — TROPONIN I: Troponin I: <0.3 ng/mL (ref <0.30)

## 2014-01-16 LAB — TSH: TSH: 1.484 u[IU]/mL (ref 0.350–4.500)

## 2014-01-16 MED ORDER — POLYVINYL ALCOHOL 1.4 % OP SOLN
2.0000 [drp] | OPHTHALMIC | Status: DC | PRN
  Filled 2014-01-16: qty 15

## 2014-01-16 MED ORDER — INSULIN ASPART 100 UNIT/ML ~~LOC~~ SOLN
0.0000 [IU] | Freq: Three times a day (TID) | SUBCUTANEOUS | Status: DC
Start: 2014-01-16 — End: 2014-01-16
  Administered 2014-01-16: 1 [IU] via SUBCUTANEOUS
  Administered 2014-01-16 (×2): 2 [IU] via SUBCUTANEOUS

## 2014-01-16 MED ORDER — INSULIN ASPART 100 UNIT/ML ~~LOC~~ SOLN
0.0000 [IU] | SUBCUTANEOUS | Status: DC
  Administered 2014-01-16 – 2014-01-17 (×3): 1 [IU] via SUBCUTANEOUS
  Administered 2014-01-17 (×2): 2 [IU] via SUBCUTANEOUS
  Administered 2014-01-17: 1 [IU] via SUBCUTANEOUS
  Administered 2014-01-17: 2 [IU] via SUBCUTANEOUS
  Administered 2014-01-18 (×3): 1 [IU] via SUBCUTANEOUS

## 2014-01-16 MED ORDER — ASPIRIN 300 MG RE SUPP
300.0000 mg | Freq: Every day | RECTAL | Status: DC
  Administered 2014-01-16 – 2014-01-17 (×2): 300 mg via RECTAL
  Filled 2014-01-16 (×3): qty 1

## 2014-01-16 MED ORDER — DEXTROSE-NACL 5-0.9 % IV SOLN
INTRAVENOUS | Status: DC
  Administered 2014-01-16 – 2014-01-17 (×2): via INTRAVENOUS

## 2014-01-16 NOTE — Progress Notes (Signed)
Physician notified: Phillips OdorGolding At: 1612  Regarding: Has pall care consult. Family member asking about plan/DNR. Want me to start code status conversation? Thanks Awaiting return response.   No return response. Pt children informed about different code statuses, no decision to be made at this time. Will continue to monitor.

## 2014-01-16 NOTE — H&P (Addendum)
INTERNAL MEDICINE TEACHING ATTENDING NOTE  Day 1 of stay  Patient name: Ricardo Johns  MRN: [id number] Date of birth: 09/28/1924   80 y.o. male with history of CAD s/p CABG and multiple stenting, type 2 DM, COPD, presented to ER in an unconscious state, with right sided weakness and in Aflutter on EKG. His history and home meds do not indicate if he has had Afib documented before.   Filed Vitals:   01/16/14 0413 01/16/14 0736 01/16/14 0738 01/16/14 1153  BP: 116/56 151/97    Pulse: 86 58    Temp: 98.2 F (36.8 C)  98.4 F (36.9 C) 99.3 F (37.4 C)  TempSrc: Oral  Oral Oral  Resp: 22 20    Height:      Weight:      SpO2: 94% 100%      On exam, the patient is unchanged from the condition described to me from yesterday. He is not responding to verbal commands, moans to painful stimuli, aphasic, eyes demonstrate normal light reflex, however he does not open his eyes on command. There is right facial droop and further testing of cranial nerves could not be done. He is moving his left leg and arm. HIs DTRs are intact. Plantars show babinski's sign bilaterally. Heart- Irreg irreg, no murmurs.  Extremties - no edema.   I have reviewed the labs and imaging.   Assessment and Plan  Ischemic Stroke, no hemorrhagic conversion seen, likely secondary to DM and HTN, however Afib is also a risk factor. We are doing risk stratification as per stroke protocol, and consulting neurology, however, at this point, given the condition of the patient and the extent of injury the prognosis looks poor. We will go ahead and complete 2decho, carotid dopplers, MRI and MRA of the brain and other tests like A1c and Lipid Panel. The swallowing status of the patient is also a question, and the family understands that he would be hefty risk for aspiration. For now, the patient is full code, and we are waiting on all tests to show up to be able to make a more informed decision for the family. For Afib - currently he is only  on aspirin suppository and rate controlled.  Prognosis might be poor for this case - depending on neurological severity of presentation, the large infarct size, development of cerebral edema to the extent of mild midline shift, advanced age of the patient and comorbidities. We will know further about the arteries occluded, and infarct volume once we get the MRI/A. I did talk to the family about the relatively poor prognosis, and the family seems inclined to talk to palliative care, for which we have put in a consult.   I have seen and evaluated this patient and discussed it with my IM resident team.  Please see the rest of the plan per resident note from today.   Aletta EdouardBHARDWAJ, Ricardo Johns 01/16/2014, 12:28 PM.  Addendum: Upon a phone conversation with Neurology PA Delton Seeavid Rinehuls and the Neurologist on Call, I enquired more about the brain edema and midline shift. According to the neurologist, if worsening neurochecks or exam is seen, then treatment with mannitol would be instituted. He was hopeful that the patient might regain consciousness once the swelling resolves, however will have severe neurologic deficits.

## 2014-01-16 NOTE — Progress Notes (Signed)
Had extensive discussion w/ son and wife about code status and now wish for [name] to be DNR/DNI. They do wish to continue with current medical plan and continue to be very optimistic at this time. Have updated the chart and will notify day team.  Signed: Lars MassonJones, Gaylyn Berish, MD 01/16/2014 7:15 PM

## 2014-01-16 NOTE — Progress Notes (Signed)
Call MR pertaining pending STAT MRI brain from admission. MR states will try to get to him today, have several patients in front of him. Will continue to monitor patient.

## 2014-01-16 NOTE — Progress Notes (Signed)
Subjective: No change since yesterday. Patient still unresponsive. No overnight events. Objective: Vital signs in last 24 hours: Filed Vitals:   01/16/14 0736 01/16/14 0738 01/16/14 1150 01/16/14 1153  BP: 151/97  146/49   Pulse: 58  61   Temp:  98.4 F (36.9 C)  99.3 F (37.4 C)  TempSrc:  Oral  Oral  Resp: 20  20   Height:      Weight:      SpO2: 100%  95%    Weight change:   Intake/Output Summary (Last 24 hours) at 01/16/14 1449 Last data filed at 01/16/14 1422  Gross per 24 hour  Intake    900 ml  Output    800 ml  Net    100 ml    General appearance: No family at bedside. GCS 10  Head: Normocephalic, without obvious abnormality, atraumatic  Eyes: pupils reactive to light  Lungs: clear to auscultation bilaterally  Heart: irregular rate, irregular rhythm, extra beats between S1 and S2  Abdomen: soft, non-tender; bowel sounds normal; no masses, no organomegaly  Extremities: no edema bilaterally  Pulses: 2+ radial pulses, but irregular rate  Neurologic:RHH. No new neurologic findings since last exam.   Lab Results: Basic Metabolic Panel:  Recent Labs Lab 01/15/14 1310  NA 139  K 4.3  CL 100  CO2 26  GLUCOSE 147*  BUN 21  CREATININE 1.01  CALCIUM 9.5   Liver Function Tests:  Recent Labs Lab 01/15/14 1310  AST 18  ALT 14  ALKPHOS 121*  BILITOT 0.6  PROT 7.9  ALBUMIN 3.7   CBC:  Recent Labs Lab 01/15/14 1310  WBC 7.4  NEUTROABS 5.4  HGB 16.4  HCT 47.2  MCV 89.2  PLT 233   Cardiac Enzymes:  Recent Labs Lab 01/16/14 1015  TROPONINI <0.30   CBG:  Recent Labs Lab 01/15/14 1909 01/15/14 2251 01/16/14 0410 01/16/14 0737 01/16/14 1150  GLUCAP 121* 151* 180* 173* 156*   Fasting Lipid Panel:  Recent Labs Lab 01/16/14 1015  CHOL 147  HDL 48  LDLCALC 74  TRIG 123  CHOLHDL 3.1   Urinalysis:  Recent Labs Lab 01/15/14 1313  COLORURINE YELLOW  LABSPEC 1.009  PHURINE 7.0  GLUCOSEU NEGATIVE  HGBUR LARGE*  BILIRUBINUR  NEGATIVE  KETONESUR NEGATIVE  PROTEINUR NEGATIVE  UROBILINOGEN 0.2  NITRITE NEGATIVE  LEUKOCYTESUR NEGATIVE   Micro Results: Recent Results (from the past 240 hour(s))  MRSA PCR SCREENING     Status: None   Collection Time    01/15/14 10:10 PM      Result Value Ref Range Status   MRSA by PCR NEGATIVE  NEGATIVE Final   Comment:            The GeneXpert MRSA Assay (FDA     approved for NASAL specimens     only), is one component of a     comprehensive MRSA colonization     surveillance program. It is not     intended to diagnose MRSA     infection nor to guide or     monitor treatment for     MRSA infections.   Studies/Results: Ct Head Wo Contrast  01/15/2014   CLINICAL DATA:  Unresponsive  EXAM: CT HEAD WITHOUT CONTRAST  TECHNIQUE: Contiguous axial images were obtained from the base of the skull through the vertex without intravenous contrast.  COMPARISON:  None.  FINDINGS: Findings are concerning for large territory left MCA distribution infarct with hyperdense left MCA sign (image 15,  series 2) and blurring of the gray-white differentiation involving the majority of the left MCA territory and associated and very mild sulcal effacement. No definitive evidence of hemorrhagic conversion of this large territory infarct.  Age-appropriate atrophy with diffuse sulcal prominence and centralized volume loss with commensurate ex vacuo dilatation of the ventricular system. There is very mild effacement of the left lateral ventricle and associated very minimal (approximately 3 mm) of left to right midline shift. No definite intraparenchymal or extra-axial mass or hemorrhage. Limited visualization of the paranasal sinuses and mastoid air cells are normal. Regional soft tissues are normal. Post bilateral cataract surgery.  IMPRESSION: Findings compatible with acute large territory left MCA distribution infarct with associated hyperdense left MCA sign, regional sulcal effacement and mild (approximately  3 mm) of left-to-right midline shift. No definitive evidence of hemorrhagic conversion of this large territory infarct.  Critical Value/emergent results were called by telephone at the time of interpretation on 01/15/2014 at 1:29 PM to Dr. Ross MarcusOURTNEY, HORTON , who verbally acknowledged these results.   Electronically Signed   By: Simonne ComeJohn  Watts M.D.   On: 01/15/2014 13:34   Medications: I have reviewed the patient's current medications. Scheduled Meds: . aspirin  300 mg Rectal Daily  . insulin aspart  0-9 Units Subcutaneous TID WC   Continuous Infusions: . dextrose 5 % and 0.45% NaCl 75 mL/hr at 01/15/14 1957   PRN Meds:. Assessment/Plan: [name] is a 80 yo man with history of CAD s/p CABG & stents, HTN, T2DM, and COPD who presents with L MCA ischemic stroke confirmed by CT head.   # L MCA ischemic stroke: Last seen normal on 01/14/2014 @ 11:30pm. Has right sided weakness. CT head has shown midline shift and hypodensity in regions surrounding L ventricle. MRI pending. Stroke very catastrophic with high mortality.  Plan  - frequent neuro checks  - 2 d echo and carotid dopplers  - lipid panel with LDL of 74 - TSH and A1c ordered -1/2 NS at 1400mL/h  -Aspirin 300mg  rectally qD  - keep on Telemetry  - permassive hypertension until at least 48 hours after stroke - Neurology recs appreciated  - brain MRI and MRA ordered.  - patient is full code currently, and family appreciate guard prognosis   - palliative care consulted on 01/15/2014 for GOC  - will consider mannitol for brain edema  #Atrial Fibrillation: Initially presented with atrial flutter. Unclear about duration. Troponins, performed this morning, and negative x1. Plan. -Continue with Troponins x2. -Keep on telemetry -Echocardiogram, as above. -Not a candidate for oral anticoagulation in the setting of acute intracranial bleed - Currently heart rate is within acceptable ranges. Will consider an small dose of metoprolol if heart rate  is higher than 120   Dispo: Disposition is deferred at this time, awaiting improvement of current medical problems.  Anticipated discharge in approximately 3-5 day(s).   The patient does have a current PCP (Pcp Not In System), therefore is not requiring OPC follow-up after discharge.   The patient does not have transportation limitations that hinder transportation to clinic appointments.  .Services Needed at time of discharge: Y = Yes, Blank = No PT:   OT:   RN:   Equipment:   Other:     LOS: 1 day   Dow AdolphKazibwe, Navah Grondin PGY 2 - Internal Medicine Teaching Service Pager: [phone] 01/16/2014, 2:49 PM

## 2014-01-16 NOTE — Progress Notes (Addendum)
*  PRELIMINARY RESULTS* Vascular Ultrasound Carotid Duplex (Doppler) has been completed.  Preliminary findings: Technically limited due to patient movement. Right = 40-59% ICA stenosis Hx ICA stent. Left = Atypical flow with loss of diastolic component throughout CCA and ICA, consistent with more distal obstruction. No significant stenosis noted in ICA.    Farrel DemarkJill Eunice, RDMS, RVT  01/16/2014, 11:45 AM

## 2014-01-16 NOTE — Progress Notes (Signed)
Stroke Team Progress Note  HISTORY Ricardo Johns is an 80 y.o. male who went to bed normal on the evening of 01/14/2014. He and his wife were visiting family her in [place]. He did not get out of bed on 01/15/2014.  When his wife went to check on him he was unable to speak and noted to have right sided weakness. EMS was called at that time and the patient was brought in for evaluation.   Date last known well: Date: 01/14/2014  Time last known well: Time: 11:30  tPA Given: No: Outside time window    SUBJECTIVE  The patient's family is at the bedside. They do report that he is drowsy today with her to yesterday.  OBJECTIVE Most recent Vital Signs: Filed Vitals:   01/16/14 0413 01/16/14 0736 01/16/14 0738 01/16/14 1153  BP: 116/56 151/97    Pulse: 86 58    Temp: 98.2 F (36.8 C)  98.4 F (36.9 C) 99.3 F (37.4 C)  TempSrc: Oral  Oral Oral  Resp: 22 20    Height:      Weight:      SpO2: 94% 100%     CBG (last 3)   Recent Labs  01/15/14 2251 01/16/14 0410 01/16/14 0737  GLUCAP 151* 180* 173*    IV Fluid Intake:   . dextrose 5 % and 0.45% NaCl 75 mL/hr at 01/15/14 1957    MEDICATIONS  . insulin aspart  0-9 Units Subcutaneous TID WC   PRN:    Diet:  NPO no liquids Activity:   DVT Prophylaxis:  SCDs  CLINICALLY SIGNIFICANT STUDIES Basic Metabolic Panel:  Recent Labs Lab 01/15/14 1310  NA 139  K 4.3  CL 100  CO2 26  GLUCOSE 147*  BUN 21  CREATININE 1.01  CALCIUM 9.5   Liver Function Tests:  Recent Labs Lab 01/15/14 1310  AST 18  ALT 14  ALKPHOS 121*  BILITOT 0.6  PROT 7.9  ALBUMIN 3.7   CBC:  Recent Labs Lab 01/15/14 1310  WBC 7.4  NEUTROABS 5.4  HGB 16.4  HCT 47.2  MCV 89.2  PLT 233   Coagulation: No results found for this basename: LABPROT, INR,  in the last 168 hours Cardiac Enzymes:  Recent Labs Lab 01/16/14 1015  TROPONINI <0.30   Urinalysis:  Recent Labs Lab 01/15/14 1313  COLORURINE YELLOW  LABSPEC 1.009  PHURINE  7.0  GLUCOSEU NEGATIVE  HGBUR LARGE*  BILIRUBINUR NEGATIVE  KETONESUR NEGATIVE  PROTEINUR NEGATIVE  UROBILINOGEN 0.2  NITRITE NEGATIVE  LEUKOCYTESUR NEGATIVE   Lipid Panel    Component Value Date/Time   CHOL 147 01/16/2014 1015   TRIG 123 01/16/2014 1015   HDL 48 01/16/2014 1015   CHOLHDL 3.1 01/16/2014 1015   VLDL 25 01/16/2014 1015   LDLCALC 74 01/16/2014 1015   HgbA1C  No results found for this basename: HGBA1C    Urine Drug Screen:   No results found for this basename: labopia, cocainscrnur, labbenz, amphetmu, thcu, labbarb    Alcohol Level: No results found for this basename: ETH,  in the last 168 hours  Ct Head Wo Contrast 01/15/2014    Findings compatible with acute large territory left MCA distribution infarct with associated hyperdense left MCA sign, regional sulcal effacement and mild (approximately 3 mm) of left-to-right midline shift. No definitive evidence of hemorrhagic conversion of this large territory infarct.       MRI of the brain  pending  MRA of the brain  pending  2D Echocardiogram  pending  Carotid Doppler  pending  CXR    EKG atrial flutter ventricular response 99 beats per minute -  For complete results please see formal report.   Therapy Recommendations pending  Physical Exam    Neurologic Examination:  Mental Status:  He is sleeping but opens eyes to sternal supply. Unable to follow verbal commands. Mute.  Cranial Nerves:  II: Discs flat bilaterally; Does not blink to confrontation from the right. Pupils equal, round, reactive to light and accommodation  III,IV, VI: ptosis not present, Left gaze preference but extra-ocular motions intact bilaterally with oculocephalic maneuver  V,VII: right facial droop  VIII: unable to test  IX,X: gag reflex reduced  XI: decreased shoulder shrug on the right  XII: unable to test  Motor:  Right : Upper extremity 0/5 Left: Upper extremity 5/5  Lower extremity 0/5 Lower extremity 5/5  Sensory: Does not  respond to noxious stimuli in any extremity  Deep Tendon Reflexes: 2+ in the upper extremities, 1+ at the knees and absent at the ankles.  Plantars:  Right: upgoing Left: upgoing  Cerebellar:  Unable to perform  Gait: Unable to test  CV: pulses palpable throughout     ASSESSMENT Ricardo Johns is a [age] y.o. male presenting with aphasia and right hemiparesis. The patient was outside the window for TPA. A CT scan revealed an acute large territory left MCA distribution infarct . Infarct felt to be embolic with probable cardioembolic source.  On no prior to admission. Now on no atherothrombotics for secondary stroke prevention. Patient with resultant aphasia and right hemiplegia. Work up underway. I had a lengthy discussion with the family. Given the large infarct, there is increased risk of significant brain swelling and attended compensations. The patient will likely has significant severe visual deficits requiring artificial feeding and also long-term placement.   Atrial flutter  Hypertension history  Diabetes mellitus  Coronary artery disease   Hospital day # 1  TREATMENT/PLAN  Add  aspirin suppository 300 mg for secondary stroke prevention.  Await MRI, 2-D echo, and carotid Doppler  Await therapy evaluation  Palliative care to be consulted  The patient was placed on to 2 hour neuro checks for 12 hours and then every 4 hours afterwards. If the patient developed significant herniation syndrome, he may need to be given mannitol 50 g every 6 hours. Additional treatment such as intubation at that time may be needed. This requires additional discussion with the family however.  The case discussed with the hospitalist.  Delton Seeavid Rinehuls PA-C [facility] Pager [phone] 01/16/2014, 11:55 AM  I have personally obtained a history, examined the patient, evaluated imaging results, and formulated the assessment and plan of care. I agree with the above.

## 2014-01-16 NOTE — Progress Notes (Addendum)
Physician notified: [phone] At: 1819  Regarding: Unable to clear secretions and cannot get with yaunker. OK to NTS? OK for saline eye drops PRN? Awaiting return response.   Returned Response at: 1820, Dr. Yetta BarreJones  Order(s): OK to NTS, OK for saline drops. orders placed in eMAR

## 2014-01-16 NOTE — Progress Notes (Signed)
Medical Student Daily Progress Note  Subjective: No events overnight. Patient made unintelligible sounds to family. But no regained consciousness.  Objective: Vital signs in last 24 hours: Filed Vitals:   01/16/14 0736 01/16/14 0738 01/16/14 1150 01/16/14 1153  BP: 151/97  146/49   Pulse: 58  61   Temp:  98.4 F (36.9 C)  99.3 F (37.4 C)  TempSrc:  Oral  Oral  Resp: 20  20   Height:      Weight:      SpO2: 100%  95%    Weight change:   Intake/Output Summary (Last 24 hours) at 01/16/14 1323 Last data filed at 01/16/14 0700  Gross per 24 hour  Intake    900 ml  Output    475 ml  Net    425 ml   Physical Exam: BP 146/49  Pulse 61  Temp(Src) 99.3 F (37.4 C) (Oral)  Resp 20  Ht 6\' 2"  (1.88 m)  Wt 78.8 kg (173 lb 11.6 oz)  BMI 22.30 kg/m2  SpO2 95% General appearance: unresponsive to voice or sternal rub, eyes closed, breathing comfortably, able to protect airway Eyes: pupils reactive to light Lungs: clear to auscultation bilaterally Heart: irregularly irregular heart rhythm with multiple sounds between S1 and S2, no murmurs or gallops Abdomen: soft, non-tender; bowel sounds normal; no masses,  no organomegaly Extremities: no edema peripherally Pulses: 2+ radial pulses Neurologic: Mental status: GCS 9 (eye opening 2, verbal 2, motor 5) R extremities: limp, no tone in arm or leg, 2+ patellar reflex, up-going toe on babinski's L extremities: tone in arm and leg, 1+ patellar reflex, up-going toe on babinski's  Lab Results: Troponins 9am: negative  Blood glucose: 156 Lipid Panel     Component Value Date/Time   CHOL 147 01/16/2014 1015   TRIG 123 01/16/2014 1015   HDL 48 01/16/2014 1015   CHOLHDL 3.1 01/16/2014 1015   VLDL 25 01/16/2014 1015   LDLCALC 74 01/16/2014 1015   ECG:  -Atrial flutter, variable AV block, occasional PVC  Micro Results: Recent Results (from the past 240 hour(s))  MRSA PCR SCREENING     Status: None   Collection Time    01/15/14 10:10 PM       Result Value Ref Range Status   MRSA by PCR NEGATIVE  NEGATIVE Final   Comment:            The GeneXpert MRSA Assay (FDA     approved for NASAL specimens     only), is one component of a     comprehensive MRSA colonization     surveillance program. It is not     intended to diagnose MRSA     infection nor to guide or     monitor treatment for     MRSA infections.   Studies/Results: Ct Head Wo Contrast  01/15/2014   CLINICAL DATA:  Unresponsive  EXAM: CT HEAD WITHOUT CONTRAST  TECHNIQUE: Contiguous axial images were obtained from the base of the skull through the vertex without intravenous contrast.  COMPARISON:  None.  FINDINGS: Findings are concerning for large territory left MCA distribution infarct with hyperdense left MCA sign (image 15, series 2) and blurring of the gray-white differentiation involving the majority of the left MCA territory and associated and very mild sulcal effacement. No definitive evidence of hemorrhagic conversion of this large territory infarct.  Age-appropriate atrophy with diffuse sulcal prominence and centralized volume loss with commensurate ex vacuo dilatation of the ventricular system. There  is very mild effacement of the left lateral ventricle and associated very minimal (approximately 3 mm) of left to right midline shift. No definite intraparenchymal or extra-axial mass or hemorrhage. Limited visualization of the paranasal sinuses and mastoid air cells are normal. Regional soft tissues are normal. Post bilateral cataract surgery.  IMPRESSION: Findings compatible with acute large territory left MCA distribution infarct with associated hyperdense left MCA sign, regional sulcal effacement and mild (approximately 3 mm) of left-to-right midline shift. No definitive evidence of hemorrhagic conversion of this large territory infarct.  Critical Value/emergent results were called by telephone at the time of interpretation on 01/15/2014 at 1:29 PM to Dr. Ross MarcusOURTNEY, HORTON  , who verbally acknowledged these results.   Electronically Signed   By: Simonne ComeJohn  Watts M.D.   On: 01/15/2014 13:34   Medications: I have reviewed the patient's current medications. Scheduled Meds: . aspirin  300 mg Rectal Daily  . insulin aspart  0-9 Units Subcutaneous TID WC   Continuous Infusions: . dextrose 5 % and 0.45% NaCl 75 mL/hr at 01/15/14 1957   PRN Meds:. Assessment/Plan: Principal Problem:   Acute ischemic left MCA stroke Active Problems:   Diabetes   CAD (coronary artery disease)   Dementia   COPD (chronic obstructive pulmonary disease)   Atrial flutter   LOS: 1 day  [name] is a 80 yo man with history of CAD s/p CABG & stents, HTN, T2DM, and COPD who presents with L MCA ischemic stroke confirmed by CT head.   # L MCA ischemic stroke: Patient has had a stroke that is not eligible for antithrombotic treatment given unknown time of stroke. Causes of stroke can be thrombotic given patient's history of CAD with stenosis, but also embolic given atrial flutter on presentation. Patient could have had a MI given CAD history, but first round of troponin is negative. 2D ECHO is pending as well as the carotid doppler results.   Stroke is likely catastrophic as patient has not gained any consciousness since arrival in ED. CT head has shown midline shift and hypodensity in regions surrounding L ventricle. Using the [place] Neurologic scale for acute ischemic stroke, patient scores <6.5, which predicts an increased 30 day mortality and poor outcomes in 6 months. His NIHSS score is between 11-15, which indicates that there is only about 23% chance of an excellent outcome. BP averages 140-180/60-70, and does not need management given his history of stroke. Family has been informed of the poor prognosis, and they have indicated desire to keep patient alive until 3rd daughter arrives. They have also expressed that they do not want to keep patient alive through extreme means, but will make a  definitive decision once the third daughter arrives.   Plan: -Consult palliative care  -1/2 NS at 17500mL/h  -Aspirin 300mg  rectally qD  -F/u 2D ECHO -F/u carotid dopplers -F/u MRI head -Telemetry for V fib or V tachy  -Hold all home medications  -Neurology recs appreciated    This is a Psychologist, occupationalMedical Student Note.  The care of the patient was discussed with Dr. Zada GirtKazibwe and the assessment and plan formulated with their assistance.  Please see their attached note for official documentation of the daily encounter.  Rhea PinkJiang, Cannen Dupras 01/16/2014, 1:23 PM

## 2014-01-16 NOTE — Progress Notes (Signed)
  Echocardiogram 2D Echocardiogram has been performed.  Ricardo Johns, Ricardo Johns 01/16/2014, 10:21 AM

## 2014-01-16 NOTE — Progress Notes (Signed)
I have seen the patient and reviewed the daily progress note by Rui MS IV and discussed the care of the patient with them.  Please see my note for my findings, assessment, and plans/additions.   Signed:  Darothy Courtright, MD PGY-2 Internal Medicine Teaching Service Pager: [phone] 

## 2014-01-16 NOTE — Progress Notes (Signed)
Physician notified: Yetta BarreJones  At: [id number]  Regarding: Pt family would like to update code status. Please come to room when available.  Awaiting return response.   Returned Response at: 1854  Order(s): Will be by shortly.

## 2014-01-16 NOTE — Progress Notes (Signed)
Physician notified: Zada GirtKazibwe At: 1504  Regarding: Note states 1/2NS for IVF, currently have d51/2NS ordered and infusing. Just checking to make sure correct fluid is ordered. Awaiting return response.   Returned Response at: 1505  Order(s): Will change IVF to D5NS.

## 2014-01-17 DIAGNOSIS — Z66 Do not resuscitate: Secondary | ICD-10-CM

## 2014-01-17 DIAGNOSIS — I6350 Cerebral infarction due to unspecified occlusion or stenosis of unspecified cerebral artery: Principal | ICD-10-CM

## 2014-01-17 DIAGNOSIS — Z515 Encounter for palliative care: Secondary | ICD-10-CM

## 2014-01-17 LAB — GLUCOSE, CAPILLARY
GLUCOSE-CAPILLARY: 132 mg/dL — AB (ref 70–99)
GLUCOSE-CAPILLARY: 143 mg/dL — AB (ref 70–99)
GLUCOSE-CAPILLARY: 150 mg/dL — AB (ref 70–99)
Glucose-Capillary: 165 mg/dL — ABNORMAL HIGH (ref 70–99)
Glucose-Capillary: 171 mg/dL — ABNORMAL HIGH (ref 70–99)
Glucose-Capillary: 174 mg/dL — ABNORMAL HIGH (ref 70–99)

## 2014-01-17 LAB — BASIC METABOLIC PANEL
BUN: 18 mg/dL (ref 6–23)
CHLORIDE: 100 meq/L (ref 96–112)
CO2: 25 meq/L (ref 19–32)
Calcium: 9.1 mg/dL (ref 8.4–10.5)
Creatinine, Ser: 1.06 mg/dL (ref 0.50–1.35)
GFR calc Af Amer: 71 mL/min — ABNORMAL LOW (ref >90)
GFR calc non Af Amer: 61 mL/min — ABNORMAL LOW (ref >90)
Glucose, Bld: 187 mg/dL — ABNORMAL HIGH (ref 70–99)
Potassium: 4.2 mEq/L (ref 3.7–5.3)
Sodium: 139 mEq/L (ref 137–147)

## 2014-01-17 LAB — APTT: APTT: 30 s (ref 24–37)

## 2014-01-17 LAB — PROTIME-INR
INR: 1.03 (ref 0.00–1.49)
Prothrombin Time: 13.3 seconds (ref 11.6–15.2)

## 2014-01-17 MED ORDER — ONDANSETRON HCL 4 MG/2ML IJ SOLN: 4.0000 mg | Freq: Four times a day (QID) | INTRAMUSCULAR | Status: DC | PRN

## 2014-01-17 MED ORDER — MORPHINE SULFATE (CONCENTRATE) 10 MG /0.5 ML PO SOLN
5.0000 mg | ORAL | Status: DC | PRN
  Administered 2014-01-17 (×2): 5 mg via ORAL
  Filled 2014-01-17 (×2): qty 0.5

## 2014-01-17 MED ORDER — LORAZEPAM 2 MG/ML IJ SOLN: 0.5000 mg | INTRAMUSCULAR | Status: DC | PRN

## 2014-01-17 MED ORDER — SCOPOLAMINE 1 MG/3DAYS TD PT72
1.0000 | MEDICATED_PATCH | TRANSDERMAL | Status: DC
  Administered 2014-01-17: 1.5 mg via TRANSDERMAL
  Filled 2014-01-17: qty 1

## 2014-01-17 MED ORDER — BISACODYL 10 MG RE SUPP: 10.0000 mg | Freq: Every day | RECTAL | Status: DC | PRN

## 2014-01-17 NOTE — Clinical Documentation Improvement (Signed)
Presents with an Acute CVA, atrial flutter, hemiplegia. Patient still unresponsive documented in 2/15 progress note.   Please further clarify what unresponsive means: Coma                     Stupor          Syncope          Other condition    Thank You, Shellee MiloEileen T Marilyn Nihiser ,RN Clinical Documentation Specialist:  [phone]  [facility]- [facility] Information Management

## 2014-01-17 NOTE — Progress Notes (Signed)
Medical Student Daily Progress Note  Subjective: No events overnight. Daughter noticed that dad is having episodes of apnea. Patient is no longer opening his eyes to sound.  Objective: Vital signs in last 24 hours: Filed Vitals:   01/17/14 0105 01/17/14 0352 01/17/14 0800 01/17/14 1131  BP: 182/82 190/78  163/63  Pulse: 84 82  71  Temp:  98.5 F (36.9 C) 98.9 F (37.2 C) 98 F (36.7 C)  TempSrc:  Oral Oral Axillary  Resp: 23 19  21   Height:      Weight:      SpO2: 94% 96%  90%   Weight change:   Intake/Output Summary (Last 24 hours) at 01/17/14 1633 Last data filed at 01/17/14 0800  Gross per 24 hour  Intake   1125 ml  Output    675 ml  Net    450 ml   Physical Exam: BP 163/63  Pulse 71  Temp(Src) 98.9 F (37.2 C) (Axillary)  Resp 21  Ht 6\' 2"  (1.88 m)  Wt 78.8 kg (173 lb 11.6 oz)  BMI 22.30 kg/m2  SpO2 90% General appearance: unresponsive to sound, lying bed, snoring with mouth open Lungs: clear to auscultation bilaterally Heart: irregular rhythm, regular rate, multiple heart sounds between S1 and S2 Abdomen: soft, non-tender; bowel sounds normal; no masses,  no organomegaly Extremities: no edema bilaterally Neurologic: Mental status: GCS 9  Motor: weakness in R arm and R leg Lab Results: Results for Ricardo Johns, Ricardo (MRN [id number]) as of 01/17/2014 16:33  Ref. Range 01/17/2014 03:50  Sodium Latest Range: 137-147 mEq/L 139  Potassium Latest Range: 3.7-5.3 mEq/L 4.2  Chloride Latest Range: 96-112 mEq/L 100  CO2 Latest Range: 19-32 mEq/L 25  BUN Latest Range: 6-23 mg/dL 18  Creatinine Latest Range: 0.50-1.35 mg/dL 4.091.06  Calcium Latest Range: 8.4-10.5 mg/dL 9.1  GFR calc non Af Amer Latest Range: >90 mL/min 61 (L)  GFR calc Af Amer Latest Range: >90 mL/min 71 (L)  Glucose Latest Range: 70-99 mg/dL 811187 (H)  Prothrombin Time Latest Range: 11.6-15.2 seconds 13.3  INR Latest Range: 0.00-1.49  1.03  APTT Latest Range: 24-37 seconds 30   Micro Results: Recent  Results (from the past 240 hour(s))  MRSA PCR SCREENING     Status: None   Collection Time    01/15/14 10:10 PM      Result Value Ref Range Status   MRSA by PCR NEGATIVE  NEGATIVE Final   Comment:            The GeneXpert MRSA Assay (FDA     approved for NASAL specimens     only), is one component of a     comprehensive MRSA colonization     surveillance program. It is not     intended to diagnose MRSA     infection nor to guide or     monitor treatment for     MRSA infections.   Studies/Results: No results found. Medications: I have reviewed the patient's current medications. Scheduled Meds: . aspirin  300 mg Rectal Daily  . insulin aspart  0-9 Units Subcutaneous 6 times per day  . scopolamine  1 patch Transdermal Q72H   Continuous Infusions: . dextrose 5 % and 0.9% NaCl 75 mL/hr at 01/17/14 0410   PRN Meds:.LORazepam, morphine CONCENTRATE, polyvinyl alcohol Assessment/Plan: Principal Problem:   Acute ischemic left MCA stroke Active Problems:   Diabetes   CAD (coronary artery disease)   Dementia   COPD (chronic obstructive pulmonary disease)  Atrial fibrillation   LOS: 2 days   [name] is a 80 yo man with history of CAD s/p CABG & stents, HTN, T2DM, and COPD who presents with L MCA ischemic stroke confirmed by CT head.   # L MCA ischemic stroke: Patient has had a stroke that is not eligible for antithrombotic treatment given unknown time of stroke. Causes of stroke can be thrombotic given patient's history of CAD with stenosis, but also embolic given atrial flutter on presentation. Patient could have had a MI given CAD history, but troponins are negative x3. 2D ECHO shows mild calcification on AV and MV with slightly enlarged R and L atria.   Stroke is likely catastrophic as patient has not gained any consciousness since arrival in ED. CT head has shown midline shift and hypodensity in regions surrounding L ventricle. Using the [place] Neurologic scale for acute  ischemic stroke, patient scores <6.5, which predicts an increased 30 day mortality and poor outcomes in 6 months. His NIHSS score is between 11-15, which indicates that there is only about 23% chance of an excellent outcome. BP averages 140-180/60-70, and does not need management given his history of stroke. Family has been informed of the poor prognosis, and they have indicated DNI/DNR. Comfort is their goal of care.  Plan:  -Consult edpalliative care  -Morphine 5mg  PRN for breathing distress -Lorazepam 0.5mg  PRN for signs of distress -Scopolamine for dizziness -D5 1/2 NS at 126mL/h  -Aspirin 300mg  rectally qD  -F/u carotid dopplers  -F/u MRI head  -Telemetry for V fib or V tachy  -Hold all home medications  -Neurology recs appreciated    This is a Psychologist, occupational Note.  The care of the patient was discussed with Dr. Luciana Axe and the assessment and plan formulated with their assistance.  Please see their attached note for official documentation of the daily encounter.  Rhea Pink 01/17/2014, 4:33 PM

## 2014-01-17 NOTE — Progress Notes (Signed)
Patient seen and examined on rounds with resident team.  Very poor prognosis and discussed this with the family and they are in agreement to focus on comfort.  Will await for palliative care discussion and now on morphine IV as needed.   Staci RighterOMER, Lilyanah Celestin, MD

## 2014-01-17 NOTE — Progress Notes (Signed)
Called MRI for update on delay. 2 patients are ahead of him with orders put in for the 14th as well.

## 2014-01-17 NOTE — Consult Note (Signed)
Ricardo Johns      DOB: 1924-05-14      KVQ:[id number]     Consult Note from the Palliative Medicine Team at [facility] Requested by:  Dr. Dina Rich     PCP: Pcp Not In System Reason for Consultation: [place] and options.    Phone Number:None  Assessment of patients Current state: Ricardo Johns is 80 yo male with a large acute left MCA ischemic infarct. Family says he went to bed normal ~11:30pm Friday night and Saturday morning his wife found him unable to speak and with noted right sided weakness and they called EMS. He and his wife were visiting their children here in [place]. They have been living with their daughter in [place] the past few years. They say he has opened his eyes a couple times and will squeeze their hands but will not respond beyond these measures. They say he has declined further the past two days. They are understanding of his poor prognosis.  Met today with Ricardo's wife Ricardo Johns), daughter Ricardo Johns- from [place]), son and daughter in law ([place] and [place]), and daughter Ricardo Johns). They have all discussed and agree that Ricardo Johns would not want to live like this and they want to focus on his comfort. We discussed his natural disease trajectory and decline. They have decided to deescalate care but would like to have time to process and are hoping we can wait until tonight or tomorrow before discontinuing IVF, monitor, and transfer to a non-monitored floor such as 6N. We did discuss hospice facility as an option for care when they are ready for full comfort path. They say they will have to think about [facility] for this option.     Goals of Care: 1.  Code Status: DNR   2. Scope of Treatment: Family to let us know when they are ready to begin deescalating care and minimizing for comfort.   4. Disposition: Hopeful for hospice facility.   3. Symptom Management:   1. Anxiety/Agitation: Ativan prn. 2. Pain: Roxanol prn. 3. Bowel Regimen:  Dulcolax supp prn.  4. Nausea/Vomiting: Ondansetron prn.  5. Terminal Secretions: Scopolamine patch ordered.  4. Psychosocial: Emotional support provided to family during difficult conversation.   5. Spiritual: Supported by their own church and are going to look into getting him [employer].    Ricardo Documents Completed or Given: Document Given Completed  Advanced Directives Pkt    MOST    DNR    Gone from My Sight    Hard Choices yes     Brief HPI: 80 yo male with acute ischemic stroke.    ROS: Unable to elicit - unresponsive.     PMH:  Past Medical History  Diagnosis Date  . Hypertension   . Diabetes mellitus without complication     type II  . MI (myocardial infarction)   . Bypass graft stenosis   . COPD (chronic obstructive pulmonary disease)      PSH: Past Surgical History  Procedure Laterality Date  . Coronary artery bypass graft  2011  . Joint replacement      hip replacement unknown laterality   I have reviewed the [place] and SH and  If appropriate update it with new information. No Known Allergies Scheduled Meds: . aspirin  300 mg Rectal Daily  . insulin aspart  0-9 Units Subcutaneous 6 times per day   Continuous Infusions: . dextrose 5 % and 0.9% NaCl 75 mL/hr at 01/17/14 0410  PRN Meds:.polyvinyl alcohol    BP 163/63  Pulse 71  Temp(Src) 98.9 F (37.2 C) (Oral)  Resp 21  Ht $R'6\' 2"'io$  (1.88 m)  Wt 78.8 kg (173 lb 11.6 oz)  BMI 22.30 kg/m2  SpO2 90%   PPS: 20%   Intake/Output Summary (Last 24 hours) at 01/17/14 1343 Last data filed at 01/17/14 0800  Gross per 24 hour  Intake   1335 ml  Output   1000 ml  Net    335 ml    Physical Exam:  General: NAD, unresponsive HEENT: [place]/AT, no JVD, dry mucous membranes Chest: CTA throughout, symmetric, non labored breathes CVS: Irreg - A fib Abdomen: Soft, NT, ND, +BS Ext: No edema, warm to touch Neuro: Unresponsive, right sided weakness  Labs: CBC    Component Value Date/Time   WBC 10.5  01/16/2014 1549   RBC 4.93 01/16/2014 1549   HGB 15.3 01/16/2014 1549   HCT 43.9 01/16/2014 1549   PLT 228 01/16/2014 1549   MCV 89.0 01/16/2014 1549   MCH 31.0 01/16/2014 1549   MCHC 34.9 01/16/2014 1549   RDW 13.3 01/16/2014 1549   LYMPHSABS 1.3 01/15/2014 1310   MONOABS 0.5 01/15/2014 1310   EOSABS 0.2 01/15/2014 1310   BASOSABS 0.0 01/15/2014 1310    BMET    Component Value Date/Time   NA 139 01/17/2014 0350   K 4.2 01/17/2014 0350   CL 100 01/17/2014 0350   CO2 25 01/17/2014 0350   GLUCOSE 187* 01/17/2014 0350   BUN 18 01/17/2014 0350   CREATININE 1.06 01/17/2014 0350   CALCIUM 9.1 01/17/2014 0350   GFRNONAA 61* 01/17/2014 0350   GFRAA 71* 01/17/2014 0350    CMP     Component Value Date/Time   NA 139 01/17/2014 0350   K 4.2 01/17/2014 0350   CL 100 01/17/2014 0350   CO2 25 01/17/2014 0350   GLUCOSE 187* 01/17/2014 0350   BUN 18 01/17/2014 0350   CREATININE 1.06 01/17/2014 0350   CALCIUM 9.1 01/17/2014 0350   PROT 7.9 01/15/2014 1310   ALBUMIN 3.7 01/15/2014 1310   AST 18 01/15/2014 1310   ALT 14 01/15/2014 1310   ALKPHOS 121* 01/15/2014 1310   BILITOT 0.6 01/15/2014 1310   GFRNONAA 61* 01/17/2014 0350   GFRAA 71* 01/17/2014 0350    Time In Time Out Total Time Spent with Ricardo Total Overall Time  1240 1400 68min 44min    Greater than 50%  of this time was spent counseling and coordinating care related to the above assessment and plan.  Vinie Sill, NP Palliative Medicine Team Pager # [phone] Team Phone # [phone]

## 2014-01-17 NOTE — Progress Notes (Signed)
BP trending up. Automatic and manual checked. No change in neuro status. MD notified. Okay with current BP, asked to call if SBP is greater than 200. Will continue to monitor.   Rochele PagesHancock, Login Muckleroy A, RN

## 2014-01-17 NOTE — Progress Notes (Signed)
Utilization review completed.  

## 2014-01-17 NOTE — Progress Notes (Signed)
Stroke Team Progress Note  HISTORY Ricardo Johns is an 80 y.o. male who went to bed normal on the evening of 01/14/2014. He and his wife were visiting family her in [place]. He did not get out of bed on 01/15/2014.  When his wife went to check on him he was unable to speak and noted to have right sided weakness. EMS was called at that time and the patient was brought in for evaluation.   Date last known well: Date: 01/14/2014  Time last known well: Time: 11:30  tPA Given: No: Outside time window    SUBJECTIVE The patient's family is at the bedside. They are interested in his prognosis and asking questions related to their upcoming decision related to care.  OBJECTIVE Most recent Vital Signs: Filed Vitals:   01/17/14 0101 01/17/14 0105 01/17/14 0352 01/17/14 0800  BP: 193/83 182/82 190/78   Pulse: 98 84 82   Temp:   98.5 F (36.9 C) 98.9 F (37.2 C)  TempSrc:   Oral Oral  Resp: 20 23 19    Height:      Weight:      SpO2: 93% 94% 96%    CBG (last 3)   Recent Labs  01/17/14 0004 01/17/14 0355 01/17/14 0816  GLUCAP 150* 165* 171*    IV Fluid Intake:   . dextrose 5 % and 0.9% NaCl 75 mL/hr at 01/17/14 0410    MEDICATIONS  . aspirin  300 mg Rectal Daily  . insulin aspart  0-9 Units Subcutaneous 6 times per day   PRN:  polyvinyl alcohol  Diet:  NPO  Activity:   DVT Prophylaxis:  SCDs  CLINICALLY SIGNIFICANT STUDIES Basic Metabolic Panel:   Recent Labs Lab 01/15/14 1310 01/17/14 0350  NA 139 139  K 4.3 4.2  CL 100 100  CO2 26 25  GLUCOSE 147* 187*  BUN 21 18  CREATININE 1.01 1.06  CALCIUM 9.5 9.1   Liver Function Tests:   Recent Labs Lab 01/15/14 1310  AST 18  ALT 14  ALKPHOS 121*  BILITOT 0.6  PROT 7.9  ALBUMIN 3.7   CBC:   Recent Labs Lab 01/15/14 1310 01/16/14 1549  WBC 7.4 10.5  NEUTROABS 5.4  --   HGB 16.4 15.3  HCT 47.2 43.9  MCV 89.2 89.0  PLT 233 228   Coagulation:   Recent Labs Lab 01/17/14 0350  LABPROT 13.3  INR  1.03   Cardiac Enzymes:   Recent Labs Lab 01/16/14 1015 01/16/14 1549 01/16/14 2100  TROPONINI <0.30 <0.30 <0.30   Urinalysis:   Recent Labs Lab 01/15/14 1313  COLORURINE YELLOW  LABSPEC 1.009  PHURINE 7.0  GLUCOSEU NEGATIVE  HGBUR LARGE*  BILIRUBINUR NEGATIVE  KETONESUR NEGATIVE  PROTEINUR NEGATIVE  UROBILINOGEN 0.2  NITRITE NEGATIVE  LEUKOCYTESUR NEGATIVE   Lipid Panel    Component Value Date/Time   CHOL 147 01/16/2014 1015   TRIG 123 01/16/2014 1015   HDL 48 01/16/2014 1015   CHOLHDL 3.1 01/16/2014 1015   VLDL 25 01/16/2014 1015   LDLCALC 74 01/16/2014 1015   HgbA1C  No results found for this basename: HGBA1C    Urine Drug Screen:   No results found for this basename: labopia,  cocainscrnur,  labbenz,  amphetmu,  thcu,  labbarb    Alcohol Level: No results found for this basename: ETH,  in the last 168 hours  CT Head 01/15/2014   Findings compatible with acute large territory left MCA distribution infarct with associated hyperdense left MCA sign,  regional sulcal effacement and mild (approximately 3 mm) of left-to-right midline shift. No definitive evidence of hemorrhagic conversion of this large territory infarct.      2D Echocardiogram  EF 55-60% with no source of embolus.   EKG atrial flutter ventricular response 99 beats per minute -  For complete results please see formal report.    Physical Exam   Neurologic Examination:  Mental Status:  He is sleeping but opens eyes to sternal supply. Unable to follow verbal commands. Mute.  Cranial Nerves:  II: Discs flat bilaterally; Does not blink to confrontation from the right. Pupils equal, round, reactive to light and accommodation  III,IV, VI: ptosis not present, Left gaze preference but extra-ocular motions intact bilaterally with oculocephalic maneuver  V,VII: right facial droop  VIII: unable to test  IX,X: gag reflex reduced  XI: decreased shoulder shrug on the right  XII: unable to test  Motor:  Right :  Upper extremity 0/5 Left: Upper extremity 5/5  Lower extremity 0/5 Lower extremity 5/5  Sensory: Does not respond to noxious stimuli in any extremity  Deep Tendon Reflexes: 2+ in the upper extremities, 1+ at the knees and absent at the ankles.  Plantars:  Right: upgoing Left: upgoing  Cerebellar:  Unable to perform  Gait: Unable to test  CV: pulses palpable throughout   ASSESSMENT [name] is a 80 y.o. male presenting with aphasia and right hemiparesis. The patient was outside the window for TPA. A CT scan revealed an acute large territory left MCA distribution infarct with cerebral edema and 3mm shift. Infarct felt to be embolic secondary to atrial flutter.  On no antithrombotics prior to admission. Now on aspirin 300 mg suppository for secondary stroke prevention. Patient with resultant global aphasia, right hemiplegia, dysphagia. Pt is now DNR. Family would like to keep pt comfortable.   Atrial flutter  Hypertension history  Diabetes mellitus  Coronary artery disease  Hospital day # 2  TREATMENT/PLAN  Recommend comfort care - withdraw current meds, IVF. May give comfort feeds should he awaken. Family is awaiting palliative care medicine. Dr. Pearlean Brownie discussed diagnosis, prognosis,  treatment options and plan of care with all family members at the bedside. Family to discuss their decision together.      Should aggressive care be desired, would change IVF to NS as Dextrose is contraindicated in acute stroke due to risk for increasing cerebral edema and continue aspirin suppository 300 mg for secondary stroke prevention.  Annie Main, MSN, RN, ANVP-BC, ANP-BC, [facility] Pager: [phone] 01/17/2014 12:02 PM  I have personally obtained a history, examined the patient, evaluated imaging results, and formulated the assessment and plan of care. I agree with the above.  Delia Heady, MD

## 2014-01-17 NOTE — Progress Notes (Signed)
Paged second contact about HR in 140-150's sustaining. Will await response. Pt is having palpitations and is "somewhat dizzy" while lying in bed. Vagal maneuvers were unsuccessful. Pt also needs medication to help suppress cough and vicodin changed to 1-2 tablets every 4 hours. Pt is being helped, but pain level is not within pt target range.

## 2014-01-17 NOTE — Progress Notes (Signed)
I have seen the patient and reviewed the daily progress note by Tally Dueui MS IV and discussed the care of the patient with them.  I agree with their findings, assessment, and plans with additions.

## 2014-01-18 ENCOUNTER — Inpatient Hospital Stay (HOSPITAL_COMMUNITY)
Admission: AD | Admit: 2014-01-18 | Discharge: 2014-01-30 | DRG: 065 | Disposition: E | Attending: Internal Medicine | Admitting: Internal Medicine

## 2014-01-18 ENCOUNTER — Encounter (HOSPITAL_COMMUNITY): Payer: Self-pay | Admitting: Internal Medicine

## 2014-01-18 DIAGNOSIS — J4489 Other specified chronic obstructive pulmonary disease: Secondary | ICD-10-CM | POA: Diagnosis present

## 2014-01-18 DIAGNOSIS — R451 Restlessness and agitation: Secondary | ICD-10-CM

## 2014-01-18 DIAGNOSIS — E119 Type 2 diabetes mellitus without complications: Secondary | ICD-10-CM | POA: Diagnosis present

## 2014-01-18 DIAGNOSIS — F411 Generalized anxiety disorder: Secondary | ICD-10-CM | POA: Diagnosis present

## 2014-01-18 DIAGNOSIS — J449 Chronic obstructive pulmonary disease, unspecified: Secondary | ICD-10-CM | POA: Diagnosis present

## 2014-01-18 DIAGNOSIS — F0390 Unspecified dementia without behavioral disturbance: Secondary | ICD-10-CM | POA: Diagnosis present

## 2014-01-18 DIAGNOSIS — R0600 Dyspnea, unspecified: Secondary | ICD-10-CM

## 2014-01-18 DIAGNOSIS — I639 Cerebral infarction, unspecified: Secondary | ICD-10-CM | POA: Diagnosis present

## 2014-01-18 DIAGNOSIS — I4891 Unspecified atrial fibrillation: Secondary | ICD-10-CM | POA: Diagnosis present

## 2014-01-18 DIAGNOSIS — I6350 Cerebral infarction due to unspecified occlusion or stenosis of unspecified cerebral artery: Principal | ICD-10-CM | POA: Diagnosis present

## 2014-01-18 DIAGNOSIS — Z66 Do not resuscitate: Secondary | ICD-10-CM | POA: Diagnosis present

## 2014-01-18 DIAGNOSIS — G8190 Hemiplegia, unspecified affecting unspecified side: Secondary | ICD-10-CM | POA: Diagnosis present

## 2014-01-18 DIAGNOSIS — R0989 Other specified symptoms and signs involving the circulatory and respiratory systems: Secondary | ICD-10-CM | POA: Diagnosis present

## 2014-01-18 DIAGNOSIS — K117 Disturbances of salivary secretion: Secondary | ICD-10-CM

## 2014-01-18 DIAGNOSIS — Z515 Encounter for palliative care: Secondary | ICD-10-CM

## 2014-01-18 DIAGNOSIS — R0609 Other forms of dyspnea: Secondary | ICD-10-CM | POA: Diagnosis present

## 2014-01-18 DIAGNOSIS — I2510 Atherosclerotic heart disease of native coronary artery without angina pectoris: Secondary | ICD-10-CM | POA: Diagnosis present

## 2014-01-18 LAB — GLUCOSE, CAPILLARY
GLUCOSE-CAPILLARY: 118 mg/dL — AB (ref 70–99)
Glucose-Capillary: 127 mg/dL — ABNORMAL HIGH (ref 70–99)
Glucose-Capillary: 136 mg/dL — ABNORMAL HIGH (ref 70–99)
Glucose-Capillary: 137 mg/dL — ABNORMAL HIGH (ref 70–99)

## 2014-01-18 MED ORDER — BISACODYL 10 MG RE SUPP: 10.0000 mg | Freq: Every day | RECTAL | Status: AC | PRN

## 2014-01-18 MED ORDER — BISACODYL 10 MG RE SUPP: 10.0000 mg | Freq: Every day | RECTAL | Status: DC | PRN

## 2014-01-18 MED ORDER — MORPHINE SULFATE 2 MG/ML IJ SOLN: 2.0000 mg | INTRAMUSCULAR | Status: DC | PRN

## 2014-01-18 MED ORDER — MORPHINE SULFATE (CONCENTRATE) 10 MG /0.5 ML PO SOLN: 5.0000 mg | ORAL | Status: DC | PRN

## 2014-01-18 MED ORDER — POLYVINYL ALCOHOL 1.4 % OP SOLN: 2.0000 [drp] | OPHTHALMIC | Status: AC | PRN

## 2014-01-18 MED ORDER — SCOPOLAMINE 1 MG/3DAYS TD PT72: 1.0000 | MEDICATED_PATCH | TRANSDERMAL | Status: DC

## 2014-01-18 MED ORDER — MORPHINE SULFATE 2 MG/ML IJ SOLN
1.0000 mg | INTRAMUSCULAR | Status: DC
  Administered 2014-01-18 – 2014-01-19 (×6): 1 mg via INTRAVENOUS
  Filled 2014-01-18 (×6): qty 1

## 2014-01-18 MED ORDER — ONDANSETRON HCL 4 MG/2ML IJ SOLN: 4.0000 mg | Freq: Four times a day (QID) | INTRAMUSCULAR | Status: AC | PRN

## 2014-01-18 MED ORDER — ATROPINE SULFATE 1 % OP SOLN: 4.0000 [drp] | OPHTHALMIC | Status: DC | PRN

## 2014-01-18 MED ORDER — ONDANSETRON HCL 4 MG/2ML IJ SOLN: 4.0000 mg | Freq: Four times a day (QID) | INTRAMUSCULAR | Status: DC | PRN

## 2014-01-18 MED ORDER — LORAZEPAM 2 MG/ML IJ SOLN: 0.5000 mg | INTRAMUSCULAR | Status: DC | PRN

## 2014-01-18 MED ORDER — MORPHINE SULFATE (CONCENTRATE) 10 MG /0.5 ML PO SOLN: 5.0000 mg | ORAL | Status: AC | PRN

## 2014-01-18 MED ORDER — ATROPINE SULFATE 1 % OP SOLN
4.0000 [drp] | OPHTHALMIC | Status: DC | PRN
  Administered 2014-01-19 (×2): 4 [drp] via SUBLINGUAL
  Filled 2014-01-18: qty 2

## 2014-01-18 MED ORDER — POLYVINYL ALCOHOL 1.4 % OP SOLN
2.0000 [drp] | OPHTHALMIC | Status: DC | PRN
  Filled 2014-01-18: qty 15

## 2014-01-18 MED ORDER — LORAZEPAM 2 MG/ML IJ SOLN: 0.5000 mg | INTRAMUSCULAR | Status: AC | PRN

## 2014-01-18 MED ORDER — DEXTROSE-NACL 5-0.9 % IV SOLN: 75.0000 mL/h | INTRAVENOUS | Status: AC

## 2014-01-18 MED ORDER — DEXTROSE-NACL 5-0.9 % IV SOLN: INTRAVENOUS | Status: DC

## 2014-01-18 NOTE — Progress Notes (Signed)
Medical Student Daily Progress Note  Subjective: No events overnight. Family at bedside  Objective: Vital signs in last 24 hours: Filed Vitals:   01/17/14 2040 01/11/2014 0007 01/22/2014 0419 01/29/2014 0745  BP: 152/65 133/61 142/67   Pulse: 92 64 69   Temp: 99.2 F (37.3 C) 99.2 F (37.3 C) 99.6 F (37.6 C) 98.6 F (37 C)  TempSrc: Axillary Axillary Axillary Oral  Resp: 21 18 19    Height:      Weight:      SpO2: 92% 94% 93%    Weight change:   Intake/Output Summary (Last 24 hours) at 01/02/2014 1156 Last data filed at 01/17/14 2044  Gross per 24 hour  Intake      0 ml  Output    525 ml  Net   -525 ml   Physical Exam: BP 142/67  Pulse 69  Temp(Src) 98.6 F (37 C) (Oral)  Resp 19  Ht 6\' 2"  (1.88 m)  Wt 78.8 kg (173 lb 11.6 oz)  BMI 22.30 kg/m2  SpO2 93% General appearance: Lying in bed, unresponsive to noise or light, breathing with much effort, chest rising up and down much more visibly, snoring heard, some coughing noticed Lungs: clear to auscultation bilaterally  Heart: irregular rhythm, regular rate, multiple heart sounds between S1 and S2  Abdomen: soft, non-tender; bowel sounds normal; no masses, no organomegaly  Extremities: no edema bilaterally  Neurologic: Mental status: GCS 9 (2 for eye opening, 2 for verbal, 5 for motor) Motor: weakness in R arm and R leg  Lab Results: Glucose: 137  Micro Results: Recent Results (from the past 240 hour(s))  MRSA PCR SCREENING     Status: None   Collection Time    01/15/14 10:10 PM      Result Value Ref Range Status   MRSA by PCR NEGATIVE  NEGATIVE Final   Comment:            The GeneXpert MRSA Assay (FDA     approved for NASAL specimens     only), is one component of a     comprehensive MRSA colonization     surveillance program. It is not     intended to diagnose MRSA     infection nor to guide or     monitor treatment for     MRSA infections.   Studies/Results: No results found. Medications: I have reviewed  the patient's current medications. Scheduled Meds: . aspirin  300 mg Rectal Daily  . insulin aspart  0-9 Units Subcutaneous 6 times per day  . scopolamine  1 patch Transdermal Q72H   Continuous Infusions: . dextrose 5 % and 0.9% NaCl Stopped (01/17/14 1744)   PRN Meds:.bisacodyl, LORazepam, morphine CONCENTRATE, ondansetron (ZOFRAN) IV, polyvinyl alcohol Assessment/Plan: Principal Problem:   Acute ischemic left MCA stroke Active Problems:   Diabetes   CAD (coronary artery disease)   Dementia   COPD (chronic obstructive pulmonary disease)   Atrial fibrillation   Palliative care encounter   DNR (do not resuscitate)   LOS: 3 days   Ricardo Johns is a 80 yo man with history of CAD s/p CABG & stents, HTN, T2DM, and COPD who presents with L MCA ischemic stroke confirmed by CT head.   # Left MCA Ischemic stroke: Patient had a catastrophic stroke on night of 01/14/2013 and on presentation he was not eligible for antithrombotic therapy. Family has been informed of the poor prognosis, and he is currently DNI/DNR and is now comfort care. We  are in the process of finding hospice placement in [place] or [place] depending on family preference.   Plan:  - Palliative care recs appreciated - full comfort care.  - [facility] - Social work and Health and safety inspectorcase manager recs for hospice placement is appreciated - Evaluation of GIP program has also been suggested if the patient qualifies. - Morphine 5mg  PRN for breathing distress  - Lorazepam 0.5mg  PRN for signs of distress  - Scopolamine for dizziness  -1/2 NS at 15200mL/h, change from D5 1/2NS as per Neurology recommendations - disc Aspirin -F/u carotid dopplers    This is a Psychologist, occupationalMedical Student Note.  The care of the patient was discussed with Dr. Luciana Axeomer and the assessment and plan formulated with their assistance.  Please see their attached note for official documentation of the daily encounter.  Ricardo Johns, Ricardo Johns 01/23/2014, 11:56 AM   I have seen the  patient and reviewed the daily progress note by Tally Dueui MS IV and discussed the care of the patient with them.  I agree with the note above with the additions/corrections embedded.    Dow Adolphichard Amous Crewe, MD PGY-2 Internal Medicine Teaching Service Pager: [phone]

## 2014-01-18 NOTE — Care Management Note (Deleted)
    Page 1 of 1   01/03/2014     3:08:11 PM   CARE MANAGEMENT NOTE 01/08/2014  Patient:  Ricardo Johns,Ricardo Johns   Account Number:  [id number]  Date Initiated:  01/24/2014  Documentation initiated by:  Donn PieriniWEBSTER,Nohealani Medinger  Subjective/Objective Assessment:   Pt admitted with CVA     Action/Plan:   PTA pt lived at home with Wife- [place]-- [facility]   Anticipated DC Date:  11-26-2014   Anticipated DC Plan:    In-house referral  Clinical Social Worker      DC Planning Services  CM consult      Choice offered to / List presented to:             Status of service:  In process, will continue to follow Medicare Important Message given?   (If response is "NO", the following Medicare IM given date fields will be blank) Date Medicare IM given:   Date Additional Medicare IM given:    Discharge Disposition:    Per UR Regulation:  Reviewed for med. necessity/level of care/duration of stay  If discussed at Long Length of Stay Meetings, dates discussed:    Comments:  01/17/2014- 1445- Donn PieriniKristi Novelle Addair RN, BSN [phone] Referral for GIP received- CSW had received referral for hospice facility also- pt has now taken a decline- spoke with Annie MainSharon Biby NP with stroke team- who states that it is not felt that [facility]- feel that GIP might be best option- PC - NP also followed up with pt- and feels that pt may be appropriate for GIP- in to speak with family along with CSW Roddie McBryant Campbell regarding GIP referral- explained process to family- and offered choice for GIP- family would like [facility]- also gave family list of hospice facilities as a backup and explained that list was just in case pt did not meet GIP criteria- family voiced understanding. - Referral for GIP called to Radonna RickerEva- Margie with [facility] to come assess pt for GIP appropriateness and see if pt meets criteria. Pt to tx to [id number]-

## 2014-01-18 NOTE — Discharge Summary (Signed)
Patient seen and managed with resident team and I agree with summary as outlined above.    Staci RighterOMER, Audra Kagel, MD

## 2014-01-18 NOTE — Discharge Summary (Signed)
Internal Medicine Teaching [facility] Discharge Note  Name: Ricardo CoupeWalter Johns MRN: [id number] DOB: 05/22/1924 80 y.o.  Date of Admission: 01/15/2014 12:38 PM Date of Discharge: 01/22/2014 Attending Physician: Gardiner Barefootobert W Comer, MD  Discharge Diagnosis: Principal Problem:   Acute ischemic left MCA stroke Active Problems:   Diabetes   CAD (coronary artery disease)   Dementia   COPD (chronic obstructive pulmonary disease)   Atrial fibrillation   Palliative care encounter   DNR (do not resuscitate)   Discharge Medications:   Medication List    STOP taking these medications       aspirin 325 MG EC tablet     Aspirin-Caffeine 500-32.5 MG Tabs     CITRUCEL 500 MG Tabs  Generic drug:  Methylcellulose (Laxative)     dutasteride 0.5 MG capsule  Commonly known as:  AVODART     gabapentin 300 MG capsule  Commonly known as:  NEURONTIN     metFORMIN 500 MG 24 hr tablet  Commonly known as:  GLUCOPHAGE-XR     mometasone-formoterol 100-5 MCG/ACT Aero  Commonly known as:  DULERA     naproxen sodium 220 MG tablet  Commonly known as:  ANAPROX     polyethylene glycol packet  Commonly known as:  MIRALAX / GLYCOLAX     REFRESH OPTIVE OP     tamsulosin 0.4 MG Caps capsule  Commonly known as:  FLOMAX      TAKE these medications       bisacodyl 10 MG suppository  Commonly known as:  DULCOLAX  Place 1 suppository (10 mg total) rectally daily as needed for mild constipation.     dextrose 5 % and 0.9% NaCl 5-0.9 % infusion  Inject 75 mL/hr into the vein continuous.     LORazepam 2 MG/ML injection  Commonly known as:  ATIVAN  Inject 0.25 mLs (0.5 mg total) into the vein every 4 (four) hours as needed for anxiety.     morphine CONCENTRATE 10 mg / 0.5 ml concentrated solution  Take 0.25 mLs (5 mg total) by mouth every 2 (two) hours as needed for severe pain or shortness of breath.     ondansetron 4 MG/2ML Soln injection  Commonly known as:  ZOFRAN  Inject 2 mLs (4 mg total)  into the vein every 6 (six) hours as needed for nausea or vomiting.     polyvinyl alcohol 1.4 % ophthalmic solution  Commonly known as:  LIQUIFILM TEARS  Place 2 drops into both eyes as needed for dry eyes.        Disposition and follow-up:   [name] was [facility] in Critical condition.  Further management per hospice team   Follow-up Appointments:      Discharge Orders   Future Orders Complete By Expires   Diet - low sodium heart healthy  As directed    Increase activity slowly  As directed       Consultations: Treatment Team:  Md Stroke, MD Palliative [facility]  Procedures Performed:  Ct Head Wo Contrast  01/15/2014   CLINICAL DATA:  Unresponsive  EXAM: CT HEAD WITHOUT CONTRAST  TECHNIQUE: Contiguous axial images were obtained from the base of the skull through the vertex without intravenous contrast.  COMPARISON:  None.  FINDINGS: Findings are concerning for large territory left MCA distribution infarct with hyperdense left MCA sign (image 15, series 2) and blurring of the gray-white differentiation involving the majority of the left MCA territory and associated and very mild sulcal  effacement. No definitive evidence of hemorrhagic conversion of this large territory infarct.  Age-appropriate atrophy with diffuse sulcal prominence and centralized volume loss with commensurate ex vacuo dilatation of the ventricular system. There is very mild effacement of the left lateral ventricle and associated very minimal (approximately 3 mm) of left to right midline shift. No definite intraparenchymal or extra-axial mass or hemorrhage. Limited visualization of the paranasal sinuses and mastoid air cells are normal. Regional soft tissues are normal. Post bilateral cataract surgery.  IMPRESSION: Findings compatible with acute large territory left MCA distribution infarct with associated hyperdense left MCA sign, regional sulcal effacement and mild  (approximately 3 mm) of left-to-right midline shift. No definitive evidence of hemorrhagic conversion of this large territory infarct.  Critical Value/emergent results were called by telephone at the time of interpretation on 01/15/2014 at 1:29 PM to Dr. Ross Marcus , who verbally acknowledged these results.   Electronically Signed   By: Simonne Come M.D.   On: 01/15/2014 13:34    2D Echo:  Study Conclusions  - Procedure narrative: Transthoracic echocardiography. Image quality was suboptimal, with poor endocardial visualization. - Left ventricle: The cavity size was normal. Wall thickness was normal. Systolic function was normal. The estimated ejection fraction was in the range of 55% to 60%. Images were inadequate for LV wall motion assessment. The study is not technically sufficient to allow evaluation of LV diastolic function. - Aortic valve: Mildly calcified annulus. Trileaflet; mildly thickened, mildly calcified leaflets. There was no stenosis. No regurgitation. - Mitral valve: Calcified annulus. Trivial regurgitation. - Left atrium: The atrium was mildly dilated. - Right atrium: The atrium was mildly dilated. - Atrial septum: No defect or patent foramen ovale was identified. - Tricuspid valve: Mild regurgitation.    Admission HPI:  Chief Complaint: Ischemic Stroke  History of Present Illness:  [name] is a 80 yo man with history of CAD s/p CABG and multiple stents, HTN, T2DM, COPD who presents with altered mental status. Patient was last seen normal at 11:30pm on 01/14/2014. Wife found husband unresponsive in the morning (6-7am) despite multiple attempts to wake up patient. Patient was transported via EMS to hospital has not regained any conscious awareness for >15 hours. Patient has been performing ADLs (dressing, driving, feeding) without any health concerns the week prior. Patient did not have any fevers, URI symptoms, headaches, SOB, or chest pain the week prior. Patient  does not have a history of seizures or clots.  Family denies any past medical history of arrhythmias, heart valve abnormalities or septum defects, but did report that he has a 50-60 pack year smoking history. No family history of stroke, heart disease, heart arrhythmias, or hypercoagulable disorders. Patient has been compliant with medication regiments and has been going to his cardiologists, and pulmonologist regularly.  Family wishes to keep patient full code until the youngest daughter arrives from [place].  Cardiologist: Hoy Finlay, MD  PCP: Lorelei Pont, MD  Hospital Course by problem list: Principal Problem:   Acute ischemic left MCA stroke Active Problems:   Diabetes   CAD (coronary artery disease)   Dementia   COPD (chronic obstructive pulmonary disease)   Atrial fibrillation   Palliative care encounter   DNR (do not resuscitate)   [name] is a 80 yo man with history of CAD s/p CABG & stents, HTN, T2DM, and COPD who presents with L MCA ischemic stroke confirmed by CT head.   Left MCA Ischemic stroke: Patient had a catastrophic stroke on night of 01/14/2013  and on presentation he was not eligible for antithrombotic therapy. Family has been informed of the poor prognosis, and he is currently DNI/DNR and is now comfort care. He was discharged to the inpatient hospice service under Dr. Phillips Odor of palliative care. Orders for prn morphine, Ativan, scopolamine, and a slow infusion of IV D5 half normal, were continued.  Discharge Vitals:  BP 163/63  Pulse 64  Temp(Src) 99.1 F (37.3 C) (Oral)  Resp 20  Ht 6\' 2"  (1.88 m)  Wt 173 lb 11.6 oz (78.8 kg)  BMI 22.30 kg/m2  SpO2 95%  Discharge Labs:  Results for orders placed during the hospital encounter of 01/15/14 (from the past 24 hour(s))  GLUCOSE, CAPILLARY     Status: Abnormal   Collection Time    01/17/14  4:30 PM      Result Value Ref Range   Glucose-Capillary 132 (*) 70 - 99 mg/dL   Comment 1 Notify RN     Comment 2  Documented in Chart    GLUCOSE, CAPILLARY     Status: Abnormal   Collection Time    01/17/14  8:39 PM      Result Value Ref Range   Glucose-Capillary 143 (*) 70 - 99 mg/dL   Comment 1 Documented in Chart     Comment 2 Notify RN    GLUCOSE, CAPILLARY     Status: Abnormal   Collection Time    Feb 12, 2014 12:08 AM      Result Value Ref Range   Glucose-Capillary 127 (*) 70 - 99 mg/dL   Comment 1 Documented in Chart     Comment 2 Notify RN    GLUCOSE, CAPILLARY     Status: Abnormal   Collection Time    02/12/2014  4:25 AM      Result Value Ref Range   Glucose-Capillary 136 (*) 70 - 99 mg/dL   Comment 1 Documented in Chart     Comment 2 Notify RN    GLUCOSE, CAPILLARY     Status: Abnormal   Collection Time    02/12/2014  7:43 AM      Result Value Ref Range   Glucose-Capillary 137 (*) 70 - 99 mg/dL  GLUCOSE, CAPILLARY     Status: Abnormal   Collection Time    2014/02/12 12:00 PM      Result Value Ref Range   Glucose-Capillary 118 (*) 70 - 99 mg/dL    Signed: Dow Adolph 2014/02/12, 4:06 PM   Time Spent on Discharge: 30 minutes Services Ordered on Discharge: Inpatient Hospice  Equipment Ordered on Discharge: None

## 2014-01-18 NOTE — Progress Notes (Signed)
Progress Note from the Palliative Medicine Team at [facility]  Subjective: [name] is less responsive today. His family have made the call to transition to full comfort and move him to 6N. They would like to pursue hospice care. His IVF have been discontinued and his medications minimized for comfort. We will continue to monitor his secretions and I will add atropine prn. We will also monitor for dyspnea/pain and consider morphine continuous infusion if needed. His family is at bedside and are tearful. His wife of 60+ years is holding his hand and comforting him. They all say that [name] would not want to live in this state of health and are hopeful for a peaceful, dignified death. They understand that anything could happen at anytime and his prognosis is very poor after this ischemic stroke at hours to days left.   Objective: No Known Allergies Scheduled Meds: . aspirin  300 mg Rectal Daily  . insulin aspart  0-9 Units Subcutaneous 6 times per day  . scopolamine  1 patch Transdermal Q72H   Continuous Infusions:  PRN Meds:.bisacodyl, LORazepam, morphine CONCENTRATE, ondansetron (ZOFRAN) IV, polyvinyl alcohol  BP 163/63  Pulse 64  Temp(Src) 99.1 F (37.3 C) (Oral)  Resp 20  Ht 6\' 2"  (1.88 m)  Wt 78.8 kg (173 lb 11.6 oz)  BMI 22.30 kg/m2  SpO2 95%   PPS: 10%     Intake/Output Summary (Last 24 hours) at 01/04/2014 1604 Last data filed at 01/17/14 2044  Gross per 24 hour  Intake      0 ml  Output    525 ml  Net   -525 ml      LBM: 01/14/14      Physical Exam:  General: NAD, unresponsive  HEENT: [place]/AT, no JVD, dry mucous membranes  Chest: CTA throughout, symmetric, non labored breathes  CVS: Irreg - A fib  Abdomen: Soft, NT, ND, +BS  Ext: No edema, warm to touch   Neuro: Unresponsive   Labs: CBC    Component Value Date/Time   WBC 10.5 01/16/2014 1549   RBC 4.93 01/16/2014 1549   HGB 15.3 01/16/2014 1549   HCT 43.9 01/16/2014 1549   PLT 228 01/16/2014 1549   MCV  89.0 01/16/2014 1549   MCH 31.0 01/16/2014 1549   MCHC 34.9 01/16/2014 1549   RDW 13.3 01/16/2014 1549   LYMPHSABS 1.3 01/15/2014 1310   MONOABS 0.5 01/15/2014 1310   EOSABS 0.2 01/15/2014 1310   BASOSABS 0.0 01/15/2014 1310    BMET    Component Value Date/Time   NA 139 01/17/2014 0350   K 4.2 01/17/2014 0350   CL 100 01/17/2014 0350   CO2 25 01/17/2014 0350   GLUCOSE 187* 01/17/2014 0350   BUN 18 01/17/2014 0350   CREATININE 1.06 01/17/2014 0350   CALCIUM 9.1 01/17/2014 0350   GFRNONAA 61* 01/17/2014 0350   GFRAA 71* 01/17/2014 0350    CMP     Component Value Date/Time   NA 139 01/17/2014 0350   K 4.2 01/17/2014 0350   CL 100 01/17/2014 0350   CO2 25 01/17/2014 0350   GLUCOSE 187* 01/17/2014 0350   BUN 18 01/17/2014 0350   CREATININE 1.06 01/17/2014 0350   CALCIUM 9.1 01/17/2014 0350   PROT 7.9 01/15/2014 1310   ALBUMIN 3.7 01/15/2014 1310   AST 18 01/15/2014 1310   ALT 14 01/15/2014 1310   ALKPHOS 121* 01/15/2014 1310   BILITOT 0.6 01/15/2014 1310   GFRNONAA 61* 01/17/2014 0350  GFRAA 71* 01/17/2014 0350     Assessment and Plan: 1. Code Status: DNR 2. Symptom Control: 1. Pain: Roxanol prn. May consider continuous morphine infusion as needed. 2. Anxiety: Ativan prn. 3. Bowel Regimen: Dulcolax supp prn. 4. Nausea/Vomiting: Ondansetron prn. 5. Terminal Secretions: Scopolamine patch in place. Atropine drops SL prn. 3. Psycho/Social: Emotional support provided to family during difficult time. 4. Spiritual: Supported by their personal church. 5. Disposition: Considering GIP vs hospice facility.     Time In Time Out Total Time Spent with Patient Total Overall Time  1200 1225     Greater than 50%  of this time was spent counseling and coordinating care related to the above assessment and plan.  Yong Channel, NP Palliative Medicine Team Pager # [phone] Team Phone # [phone]

## 2014-01-18 NOTE — Discharge Instructions (Signed)
·   Thank you for allowing us to be involved in your healthcare while you were hospitalized at [facility].  °· Please note that there have been changes to your home medications.  --> PLEASE LOOK AT YOUR DISCHARGE MEDICATION LIST FOR DETAILS. °

## 2014-01-18 NOTE — Progress Notes (Signed)
Stroke Team Progress Note  HISTORY Ricardo Johns is an 80 y.o. male who went to bed normal on the evening of 01/14/2014. He [place]. He did not get out of bed on 01/15/2014.  When his wife went to check on him he was unable to speak and noted to have right sided weakness. EMS was called at that time and the patient was brought in for evaluation.   Date last known well: Date: 01/14/2014  Time last known well: Time: 11:30  tPA Given: No: Outside time window  SUBJECTIVE Multiple family at bedside. Tearful. They have decided on full comfort care.  OBJECTIVE Most recent Vital Signs: Filed Vitals:   01/17/14 2040 01/19/2014 0007 01/19/2014 0419 01/09/2014 0745  BP: 152/65 133/61 142/67   Pulse: 92 64 69   Temp: 99.2 F (37.3 C) 99.2 F (37.3 C) 99.6 F (37.6 C) 98.6 F (37 C)  TempSrc: Axillary Axillary Axillary Oral  Resp: 21 18 19    Height:      Weight:      SpO2: 92% 94% 93%    CBG (last 3)   Recent Labs  01/19/2014 0008 01/08/2014 0425 01/23/2014 0743  GLUCAP 127* 136* 137*    IV Fluid Intake:   . dextrose 5 % and 0.9% NaCl Stopped (01/17/14 1744)    MEDICATIONS  . aspirin  300 mg Rectal Daily  . insulin aspart  0-9 Units Subcutaneous 6 times per day  . scopolamine  1 patch Transdermal Q72H   PRN:  bisacodyl, LORazepam, morphine CONCENTRATE, ondansetron (ZOFRAN) IV, polyvinyl alcohol  Diet:  NPO  Activity:   DVT Prophylaxis:  SCDs  CLINICALLY SIGNIFICANT STUDIES Basic Metabolic Panel:   Recent Labs Lab 01/15/14 1310 01/17/14 0350  NA 139 139  K 4.3 4.2  CL 100 100  CO2 26 25  GLUCOSE 147* 187*  BUN 21 18  CREATININE 1.01 1.06  CALCIUM 9.5 9.1   Liver Function Tests:   Recent Labs Lab 01/15/14 1310  AST 18  ALT 14  ALKPHOS 121*  BILITOT 0.6  PROT 7.9  ALBUMIN 3.7   CBC:   Recent Labs Lab 01/15/14 1310 01/16/14 1549  WBC 7.4 10.5  NEUTROABS 5.4  --   HGB 16.4 15.3  HCT 47.2 43.9  MCV 89.2 89.0  PLT  233 228   Coagulation:   Recent Labs Lab 01/17/14 0350  LABPROT 13.3  INR 1.03   Cardiac Enzymes:   Recent Labs Lab 01/16/14 1015 01/16/14 1549 01/16/14 2100  TROPONINI <0.30 <0.30 <0.30   Urinalysis:   Recent Labs Lab 01/15/14 1313  COLORURINE YELLOW  LABSPEC 1.009  PHURINE 7.0  GLUCOSEU NEGATIVE  HGBUR LARGE*  BILIRUBINUR NEGATIVE  KETONESUR NEGATIVE  PROTEINUR NEGATIVE  UROBILINOGEN 0.2  NITRITE NEGATIVE  LEUKOCYTESUR NEGATIVE   Lipid Panel    Component Value Date/Time   CHOL 147 01/16/2014 1015   TRIG 123 01/16/2014 1015   HDL 48 01/16/2014 1015   CHOLHDL 3.1 01/16/2014 1015   VLDL 25 01/16/2014 1015   LDLCALC 74 01/16/2014 1015   HgbA1C  No results found for this basename: HGBA1C    Urine Drug Screen:   No results found for this basename: labopia,  cocainscrnur,  labbenz,  amphetmu,  thcu,  labbarb    Alcohol Level: No results found for this basename: ETH,  in the last 168 hours  CT Head 01/15/2014   Findings compatible with acute large territory left MCA distribution infarct with  associated hyperdense left MCA sign, regional sulcal effacement and mild (approximately 3 mm) of left-to-right midline shift. No definitive evidence of hemorrhagic conversion of this large territory infarct.      2D Echocardiogram  EF 55-60% with no source of embolus.   EKG atrial flutter ventricular response 99 beats per minute -  For complete results please see formal report.    Physical Exam   Neurologic Examination:  Mental Status:  He is sleeping but opens eyes to sternal supply. Unable to follow verbal commands. Mute.  Cranial Nerves:  II: Discs flat bilaterally; Does not blink to confrontation from the right. Pupils equal, round, reactive to light and accommodation  III,IV, VI: ptosis not present, Left gaze preference but extra-ocular motions intact bilaterally with oculocephalic maneuver  V,VII: right facial droop  VIII: unable to test  IX,X: gag reflex reduced   XI: decreased shoulder shrug on the right  XII: unable to test  Motor:  Right : Upper extremity 0/5 Left: Upper extremity 5/5  Lower extremity 0/5 Lower extremity 5/5  Sensory: Does not respond to noxious stimuli in any extremity  Deep Tendon Reflexes: 2+ in the upper extremities, 1+ at the knees and absent at the ankles.  Plantars:  Right: upgoing Left: upgoing  Cerebellar:  Unable to perform  Gait: Unable to test  CV: pulses palpable throughout   ASSESSMENT Ricardo Johns is a 80 y.o. male presenting with aphasia and right hemiparesis. The patient was outside the window for TPA. A CT scan revealed an acute large territory left MCA distribution infarct with cerebral edema and 3mm shift. Infarct felt to be embolic secondary to atrial flutter.  On no antithrombotics prior to admission. Now on aspirin 300 mg suppository for secondary stroke prevention. Patient with resultant global aphasia, right hemiplegia, dysphagia. Pt is now DNR. Family have decided on comfort care and are interested in Hospice services. They are meeting with palliative care. Patient with 20 sec periods of apnea occuring more frequently than every minute. Uncertain he would survive transport to an outside facility.   Atrial flutter  Hypertension history  Diabetes mellitus  Coronary artery disease  Hospital day # 3  TREATMENT/PLAN  Agree with plans for comfort care - would be a good candidate for GIP from neuro standpoint. Life expectancy is days. Will defer to primary team and palliative care team.  Dr. Pearlean Brownie discussed diagnosis, prognosis,  treatment options and plan of care with all family members at the bedside. He supported them in their decision.  Nothing further to add from the stroke standpoint. Will sign off. Please call with any questions.  Annie Main, MSN, RN, ANVP-BC, ANP-BC, [facility] Pager: [phone] 01/12/2014 10:32 AM  I have personally obtained a history,  examined the patient, evaluated imaging results, and formulated the assessment and plan of care. I agree with the above. Delia Heady, MD

## 2014-01-18 NOTE — Progress Notes (Signed)
   CARE MANAGEMENT NOTE 01/10/2014  Patient:  Ricardo Johns,Prashant   Account Number:  [id number]  Date Initiated:  01/26/2014  Documentation initiated by:  Donn PieriniWEBSTER,Shammond Arave  Subjective/Objective Assessment:   Pt admitted with CVA     Action/Plan:   PTA pt lived at home with Wife- [place]   Anticipated DC Date:  Jun 27, 2014   Anticipated DC Plan:    In-house referral  Clinical Social Worker      DC Planning Services  CM consult      Choice offered to / List presented to:             Status of service:  In process, will continue to follow Medicare Important Message given?   (If response is "NO", the following Medicare IM given date fields will be blank) Date Medicare IM given:   Date Additional Medicare IM given:    Discharge Disposition:    Per UR Regulation:  Reviewed for med. necessity/level of care/duration of stay  If discussed at Long Length of Stay Meetings, dates discussed:    Comments:  01/26/2014- 1445- Donn PieriniKristi Lukah Goswami RN, BSN [phone] Referral for GIP received- CSW had received referral for hospice facility also- pt has now taken a decline- spoke with Annie MainSharon Biby NP with stroke team- who states that it is not felt that [place] [facility] [place] [facility] [facility]- feel that GIP might be best option- PC - NP also followed up with pt- and feels that pt may be appropriate for GIP- in to speak with family along with CSW Roddie McBryant Campbell regarding GIP referral- explained process to family- and offered choice for GIP- family would like [facility]- also gave family list of hospice facilities as a backup and explained that list was just in case pt did not meet GIP criteria- family voiced understanding. - Referral for GIP called to Radonna RickerEva- Margie with [facility] to come assess pt for GIP appropriateness and see if pt meets criteria. Pt to tx to 6N28-

## 2014-01-19 DIAGNOSIS — IMO0002 Reserved for concepts with insufficient information to code with codable children: Secondary | ICD-10-CM

## 2014-01-19 DIAGNOSIS — R0989 Other specified symptoms and signs involving the circulatory and respiratory systems: Secondary | ICD-10-CM

## 2014-01-19 DIAGNOSIS — K1370 Unspecified lesions of oral mucosa: Secondary | ICD-10-CM

## 2014-01-19 DIAGNOSIS — R0609 Other forms of dyspnea: Secondary | ICD-10-CM

## 2014-01-19 MED ORDER — MORPHINE SULFATE 2 MG/ML IJ SOLN
1.0000 mg | INTRAMUSCULAR | Status: DC
  Administered 2014-01-19 (×2): 1 mg via INTRAVENOUS
  Filled 2014-01-19 (×2): qty 1

## 2014-01-19 MED ORDER — LORAZEPAM 2 MG/ML IJ SOLN
0.5000 mg | INTRAMUSCULAR | Status: DC
  Administered 2014-01-19 (×4): 0.5 mg via INTRAVENOUS
  Filled 2014-01-19 (×4): qty 1

## 2014-01-19 MED ORDER — MORPHINE BOLUS VIA INFUSION
1.0000 mg | INTRAVENOUS | Status: DC | PRN
  Filled 2014-01-19: qty 1

## 2014-01-19 MED ORDER — MORPHINE SULFATE 10 MG/ML IJ SOLN
1.0000 mg/h | INTRAVENOUS | Status: DC
  Filled 2014-01-19: qty 10

## 2014-01-19 NOTE — Progress Notes (Signed)
Patient ZO:XWRUEA:Ricardo Johns      DOB: 03/01/1924      VWU:[id number]  Called by nursing to report increased work of breathing requiring more frequent prn dosing. Nursing states rate is 26 but use of accessory muscles is increased and family feels he is in more distress. Will start low dose morphine drp to help improve symptom management.   Alvia Jablonski L. Ladona Ridgelaylor, MD MBA The Palliative Medicine Team at [facility] Team Phone: [phone] Pager: [phone]

## 2014-01-19 NOTE — Progress Notes (Signed)
Chaplain received [facility] for [facility]. Patient was unresponsive, family was present. Offered emotional and spiritual support, imposition of ashes, prayer, and empathic listening and a caring presence. Please page for additional support. Family was very Adult nurseappreciative.   Maurene CapesHillary D Irusta [phone] General: [phone]

## 2014-01-19 NOTE — H&P (Addendum)
Palliative Medicine Team GIP Hospice Admission H&P  Hospice Dx: Stroke Prognosis: 5-7 days Symptoms: Agitation, Dyspnea  Request received for GIP Hospice Admission at 4PM. [facility] admitted and consents were signed. PMT will assume attending. Patient seen and evaluated this evening. I started him on scheduled IV morphine for his dyspnea and pain. Please see admission H&P and PMT progress note for additional details.  Anderson MaltaElizabeth Golding, DO Palliative Medicine

## 2014-01-19 NOTE — Progress Notes (Signed)
Inpatient Rm 6N28 Tawni LevyW Antonini -[facility] RN Visit- M. Konrad DoloresLester, RN    GIP Status level of care; Related [facility] dx CVA (436) DNR code status  Pt seen at bedside, lying on back, snoring loudly, not responsive to voice or light touch, less gurgling of upper airway secretions this afternoon; nailbeds dusky/blue tint, hands warm, feet/toes cool/cold to touch; family aware of pt changing EOL s/sx emotional support offered; per discussion with Dr Ladona Ridgelaylor, medication adjustments were made to address earlier symptoms-chart notes scheduled Ativan initiated today as pt with increased agitation; two daughters, d-il, granddaughter and wife, along with pt's small dog Lollie SailsHarry -at bedside; daughters feel pt has become more comfortable as the day has gone on. [facility] continues to follow -please call Judie Grieve[facility] W [phone] with any hospice needs Thank you Valente DavidMargie Mattison Golay, RN, [facility] [phone])

## 2014-01-19 NOTE — Progress Notes (Addendum)
Inpatient Rm 6N28 Ricardo Johns [facility] Visit- M. Konrad DoloresLester, RN Late entry Patient admitted last evening to [facility] GIP Status level of care; Related [facility] dx CVA (436) DNR code status   Pt seen last evening at bedside, not responsive to voice, light touch, audible upper airway secretions noted; slight restlessness of LLE noted on this visit; wife , niece, nephew at bedside children on way to room; Dr Phillips OdorGolding PMT to see pt/family [facility] will follow daily for support; Please call [facility] @ [phone]  with any hospice needs.   Thank you.  Valente DavidMargie Eknoor Novack, RN  [facility]  Hospice Liaison  [phone])

## 2014-01-19 NOTE — Progress Notes (Signed)
Patient ZO:XWRUEA:Ricardo Johns      DOB: 07/10/1924      VWU:[id number]  Rechecked patient this afternoon.  Less agitated with scheduled ativan which family was grateful for , but remains with increased work of breathing.  Plan to schedule his morphine q 2 hours with q 1 hour prn. Updated family at the bedside.  Puppy Sherilyn CooterHenry keeping guard over both his parents.  Emotional support offered.   Vannia Pola L. Ladona Ridgelaylor, MD MBA The Palliative Medicine Team at [facility] Team Phone: [phone] Pager: [phone]

## 2014-01-19 NOTE — Progress Notes (Signed)
Patient ZO:XWRUEA:Ricardo Johns      DOB: 01/29/1924      VWU:[id number]   Palliative Medicine Team at [facility] Progress Note    Subjective: Patient restless this am.  Breath is ragged and dry. Just received his morphine.  With tactile stimuli patient attempts to open eyes and is moving left arm and leg. Daughter at bedside, wife resting over on the sofa.  Daughter trying to decide on time line for heading home and coming back for the funeral.  Will remeet with them this afternoon to make comparison and help her decide. For now need to get patient better settled.     Filed Vitals:   01/19/14 0630  BP: 156/58  Pulse: 43  Temp: 99.1 F (37.3 C)  Resp: 20   Physical exam: General: slightly agitated, moving left arm and leg, tachypnea Pupils note examined,  Mouth dry some posterior pharynx secretions gurgling but not accessible to suction Chest : decreased but clear CVS: bradycardic, S1, S2 Abd: soft no grimace, positive bowel sounds Ext: warm. No mottling, only moving left arm and left , hemiplegic on the right Neuro: dense hemiparesis on the right, moving left side, agitated but not able to fully awaken  Assessment and plan: 80 yr old white male found unresponsive.  Fully functional prior to event.  Diagnosis, large left MCA stroke .  Patient has been converted to comfort care under hospice.  Updated family and will return later today to recheck and reupdate. Patient agitated this am .  Morphine scheduled and just delivered.  Will change ativan to scheduled and consider up titration if not effective.  1.  DNR/ DNI  2.  Tachypnea/Dyspnea:  Agree with scheduled morphine , may need to make the interval q 2 hours depending on symptoms.  Low threshold for initiating a continuous drip later today if symptoms warrant. Treat anxiety.  3.  Anxiety/agitation:  Schedule ativan .  May need to up titrate to 1 mg if 0.5 mg not effective.  4.  Terminal secretions: agree with scopolamine and  atropine.  Discussed with  Nursing and adjustments made.  Total time 805 am to 830 am    Tracyann Duffell L. Ladona Ridgelaylor, MD MBA The Palliative Medicine Team at [facility] Team Phone: [phone] Pager: [phone]

## 2014-01-30 NOTE — Progress Notes (Signed)
Pt on assessment was without pulse or respirations at 0020. Charge nurse verified absence of pulse. Palliative MD contacted. [place] donor service and bed control notified.

## 2014-01-30 NOTE — Discharge Summary (Signed)
Death Summary  Ricardo CoupeWalter Johns UJW:[id number] DOB: 06/03/1924 DOA: 01-26-2014  PCP: Dr. Ronnette HilaVijah Patel PCP/Office notified:  Will atttempt to locate  Admit date: 01-26-2014 Date of Death: 01/03/2014  Final Diagnoses:  Active Problems:   Stroke dyspnea/tachypnea Terminal secretions Agitation  History of present illness: 80 yr old white male found with altered mental status by his wife at home.  Imagining confirmed large MCA stroke on the left.    Hospital Course:  Patient was admitted to the hospital for further evaluation.  He did not regain full consciousness and so his family elected full comfort care with conversion to Hospice level of care.  He was treated for dyspnea/tachypnea, agitation and during the night had increased symptoms which warranted a request for a morphine drip.  The patient became pulseless and apneaic before initiating the morphine drip.  His time of death was 1220 am.     Time:  1220 am  Signed:  Yilia Sacca L. Ladona Ridgelaylor, MD MBA The Palliative Medicine Team at [facility] Team Phone: [phone] Pager: [phone]

## 2014-01-30 DEATH — deceased

## 2014-02-15 NOTE — Consult Note (Signed)
I have reviewed and discussed the care of this patient in detail with the nurse practitioner including pertinent patient records, physical exam findings and data. I agree with details of this encounter.  

## 2014-07-17 IMAGING — CT CT HEAD W/O CM
1 series · 15 of 30 positions shown, 19 images · non-contrast
Comparison: None.

CLINICAL DATA: Unresponsive

EXAM:
CT HEAD WITHOUT CONTRAST
TECHNIQUE: Contiguous axial images were obtained from the base of the skull
through the vertex without intravenous contrast.

[Series 2: head 5.0 h30s · axial · 0.44mm/px · z∈[+542,+687]mm · 15 of 33 slices shown, 19 images]
[im 2/33  brain]
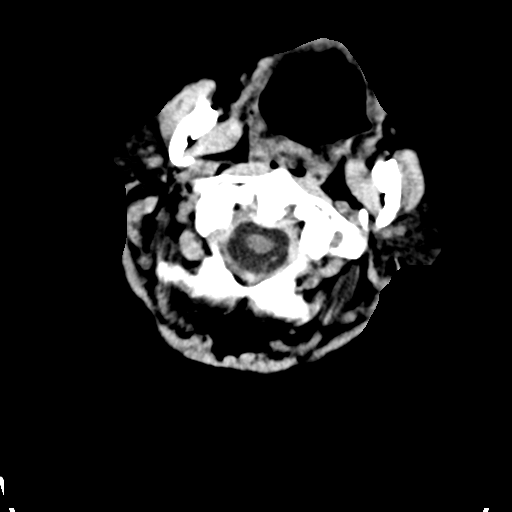
[im 2/33  bone]
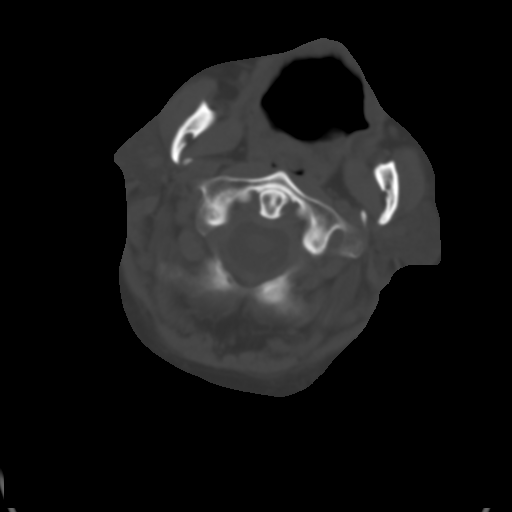
[im 4/33  brain]
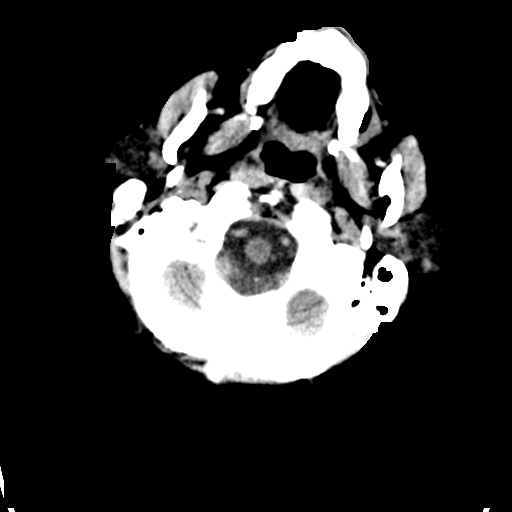
[im 6/33  brain]
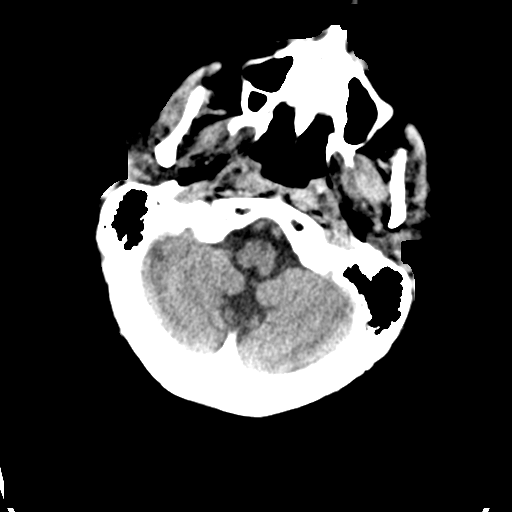
[im 8/33  brain]
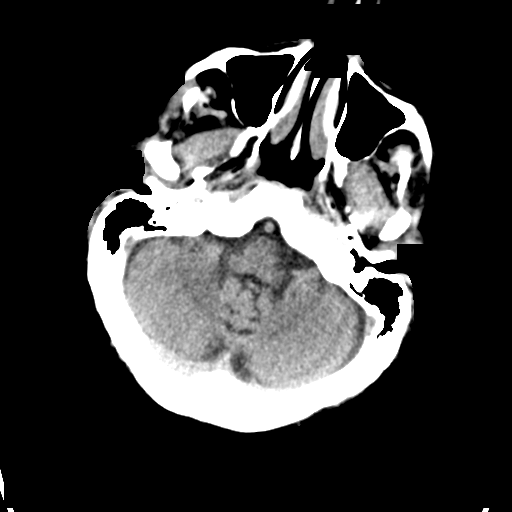
[im 10/33  brain]
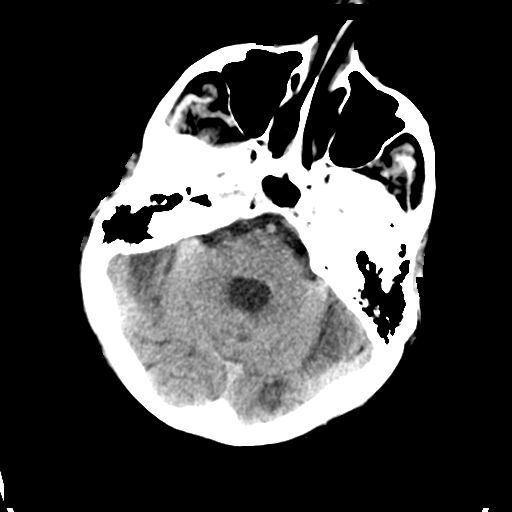
[im 10/33  bone]
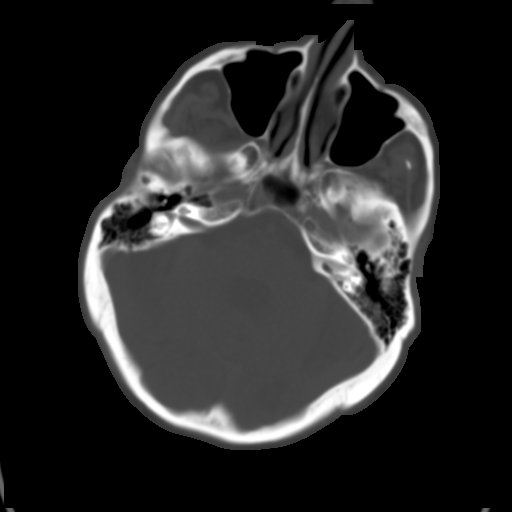
[im 13/33  brain]
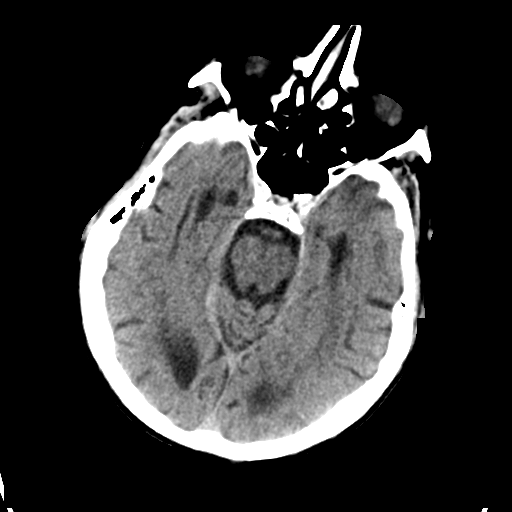
[im 15/33  brain]
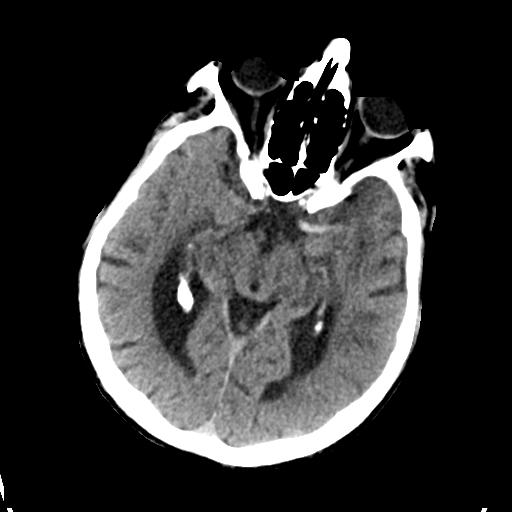
[im 17/33  brain]
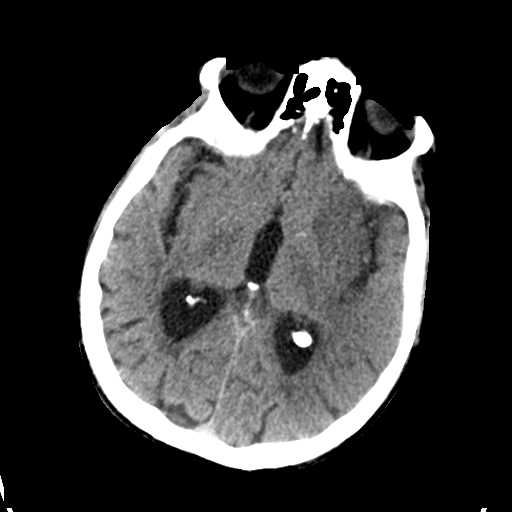
[im 18/33  brain]
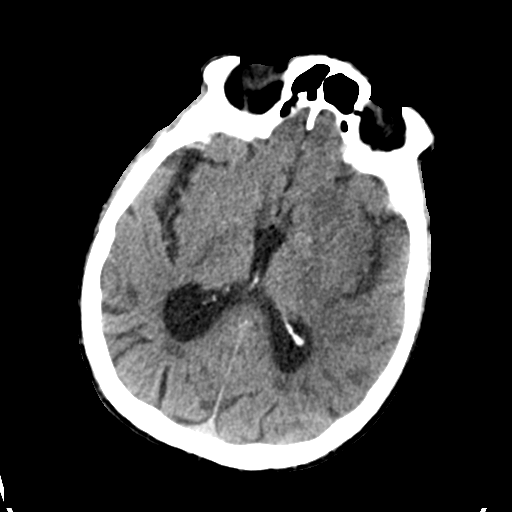
[im 18/33  bone]
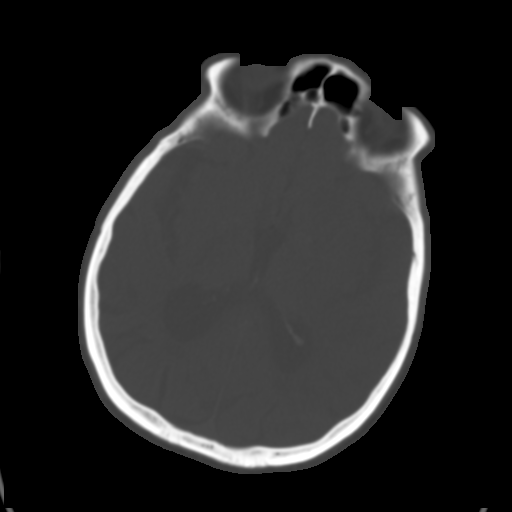
[im 20/33  brain]
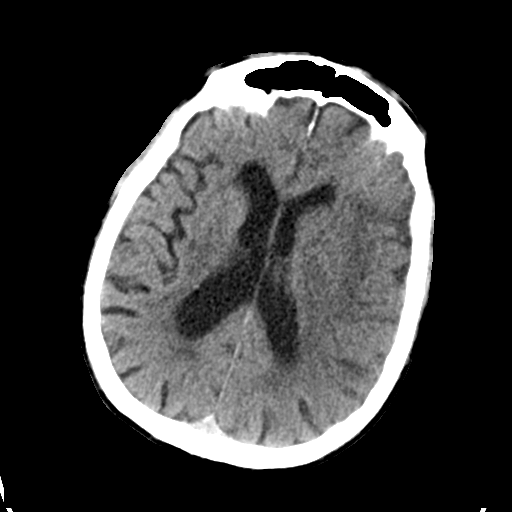
[im 23/33  brain]
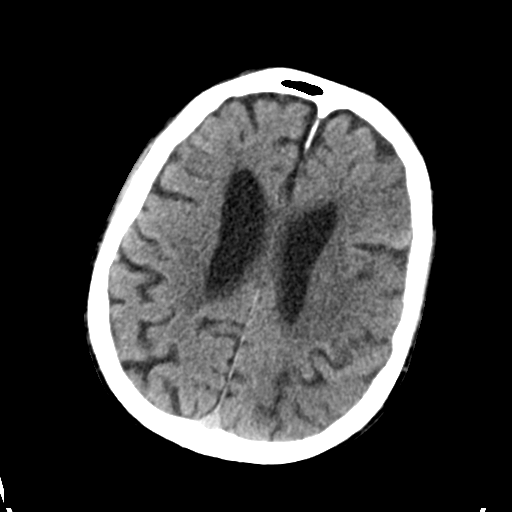
[im 25/33  brain]
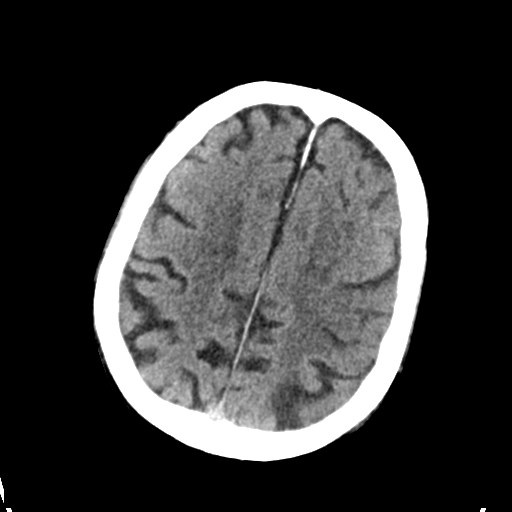
[im 27/33  brain]
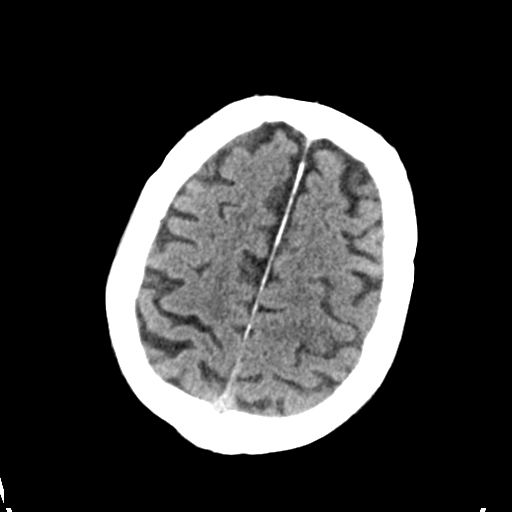
[im 27/33  bone]
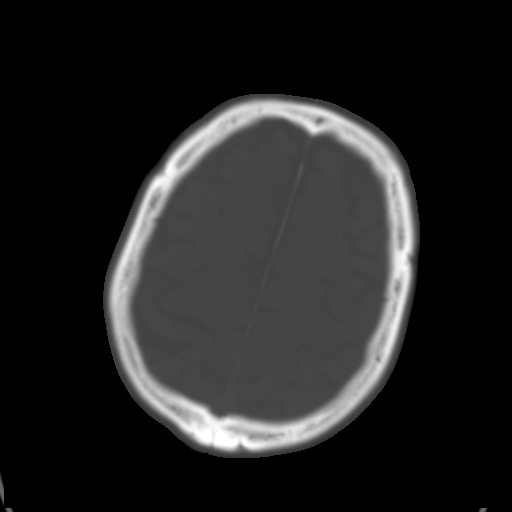
[im 29/33  brain]
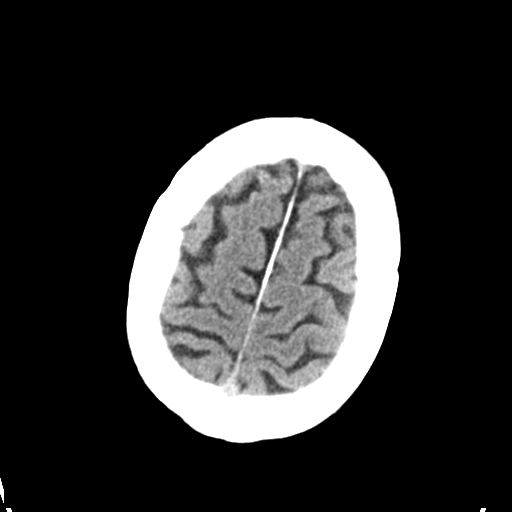
[im 31/33  brain]
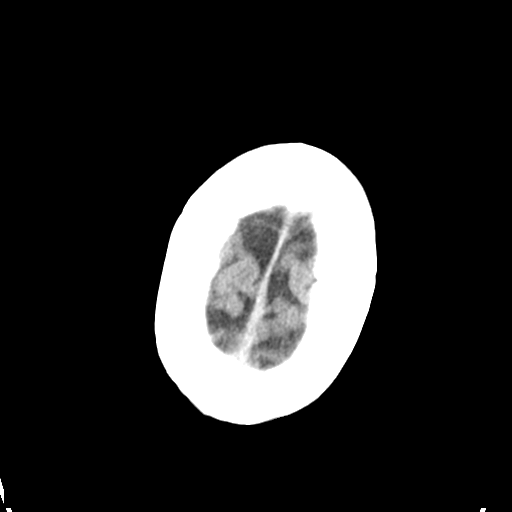

[15 of 30 positions shown; findings below may reference images not displayed]

FINDINGS: Findings are concerning for large territory left MCA distribution
infarct with hyperdense left MCA sign (image 15, series 2) and
blurring of the gray-white differentiation involving the majority of
the left MCA territory and associated and very mild sulcal
effacement. No definitive evidence of hemorrhagic conversion of this
large territory infarct.

Age-appropriate atrophy with diffuse sulcal prominence and
centralized volume loss with commensurate ex vacuo dilatation of the
ventricular system. There is very mild effacement of the left
lateral ventricle and associated very minimal (approximately 3 mm)
of left to right midline shift. No definite intraparenchymal or
extra-axial mass or hemorrhage. Limited visualization of the
paranasal sinuses and mastoid air cells are normal. Regional soft
tissues are normal. Post bilateral cataract surgery.
IMPRESSION: Findings compatible with acute large territory left MCA distribution
infarct with associated hyperdense left MCA sign, regional sulcal
effacement and mild (approximately 3 mm) of left-to-right midline
shift. No definitive evidence of hemorrhagic conversion of this
large territory infarct.

Critical Value/emergent results were called by telephone at the time
of interpretation on 01/15/2014 at [DATE] to Dr. BASKEWITSCH, CHENGGEN ,
who verbally acknowledged these results.
